# Patient Record
Sex: Male | Born: 1960 | Race: Black or African American | Marital: Single | Smoking: Former smoker
Health system: Northeastern US, Academic
[De-identification: ages and names within clinical notes are randomized; demographics above are authoritative.]

## PROBLEM LIST (undated history)

## (undated) DIAGNOSIS — I10 Essential (primary) hypertension: Secondary | ICD-10-CM

## (undated) DIAGNOSIS — F32A Depression, unspecified: Secondary | ICD-10-CM

## (undated) DIAGNOSIS — E119 Type 2 diabetes mellitus without complications: Secondary | ICD-10-CM

## (undated) DIAGNOSIS — M549 Dorsalgia, unspecified: Secondary | ICD-10-CM

## (undated) HISTORY — PX: APPENDECTOMY: SHX54

## (undated) HISTORY — PX: ANKLE SURGERY: SHX546

## (undated) HISTORY — DX: Dorsalgia, unspecified: M54.9

## (undated) HISTORY — PX: ORTHOPEDIC SURGERY: SHX850

---

## 2006-10-13 ENCOUNTER — Emergency Department (HOSPITAL_COMMUNITY): Admission: EM | Admit: 2006-10-13 | Discharge: 2006-10-13 | Payer: Self-pay | Admitting: Emergency Medicine

## 2007-03-07 ENCOUNTER — Emergency Department (HOSPITAL_COMMUNITY): Admission: EM | Admit: 2007-03-07 | Discharge: 2007-03-07 | Payer: Self-pay | Admitting: Emergency Medicine

## 2007-03-15 ENCOUNTER — Emergency Department (HOSPITAL_COMMUNITY): Admission: EM | Admit: 2007-03-15 | Discharge: 2007-03-15 | Payer: Self-pay | Admitting: Emergency Medicine

## 2007-03-24 ENCOUNTER — Emergency Department (HOSPITAL_COMMUNITY): Admission: EM | Admit: 2007-03-24 | Discharge: 2007-03-25 | Payer: Self-pay | Admitting: Emergency Medicine

## 2009-03-21 ENCOUNTER — Encounter: Payer: Self-pay | Admitting: Cardiology

## 2009-03-21 ENCOUNTER — Encounter: Payer: Self-pay | Admitting: Cardiovascular Disease

## 2009-03-22 ENCOUNTER — Other Ambulatory Visit: Payer: Self-pay | Admitting: Cardiology

## 2010-04-05 ENCOUNTER — Ambulatory Visit
Admit: 2010-04-05 | Discharge: 2010-04-05 | Disposition: A | Discharge status: Home / Self Care | Payer: Self-pay | Source: Ambulatory Visit | Attending: Family Medicine | Admitting: Family Medicine

## 2010-04-05 LAB — COMPREHENSIVE METABOLIC PANEL
ALT: 26 U/L (ref 0–50)
AST: 19 U/L (ref 0–50)
Albumin: 4.5 g/dL (ref 3.5–5.2)
Alk Phos: 52 U/L (ref 40–130)
Anion Gap: 9 (ref 7–16)
Bilirubin,Total: 0.4 mg/dL (ref 0.0–1.2)
CO2: 28 mmol/L (ref 20–28)
Calcium: 8.9 mg/dL (ref 8.6–10.2)
Chloride: 105 mmol/L (ref 96–108)
Creatinine: 1.04 mg/dL (ref 0.67–1.17)
GFR,Black: >59 *
GFR,Caucasian: >59 *
Glucose: 84 mg/dL (ref 74–106)
Lab: 11 mg/dL (ref 6–20)
Potassium: 4.3 mmol/L (ref 3.3–5.1)
Sodium: 142 mmol/L (ref 133–145)
Total Protein: 7.2 g/dL (ref 6.3–7.7)

## 2010-04-25 ENCOUNTER — Ambulatory Visit
Admit: 2010-04-25 | Discharge: 2010-04-25 | Disposition: A | Discharge status: Home / Self Care | Payer: Self-pay | Source: Ambulatory Visit | Attending: Pediatrics | Admitting: Pediatrics

## 2010-04-25 LAB — URINALYSIS WITH MICROSCOPIC
Blood,UA: NEGATIVE
Ketones, UA: NEGATIVE
Leuk Esterase,UA: NEGATIVE
Nitrite,UA: NEGATIVE
Protein,UA: NEGATIVE mg/dL
RBC,UA: 1 /HPF (ref 0–2)
Specific Gravity,UA: 1.02 (ref 1.002–1.030)
WBC,UA: <1 /HPF (ref 0–5)
pH,UA: 5.5 (ref 5.0–8.0)

## 2010-04-26 LAB — MICROALBUMIN, URINE, RANDOM
Creatinine,UR: 215 mg/dL (ref 20–300)
Microalb/Creat Ratio: <1.4 mg MA/g CR (ref 0.0–29.9)
Microalbumin,UR: <0.3 mg/dL

## 2010-04-26 LAB — DRUG SCREEN CHEMICAL DEPENDENCY, URINE
Amphetamine,UR: NEGATIVE
Benzodiazepinen,UR: NEGATIVE
Cocaine/Metab,UR: NEGATIVE
Opiates,UR: NEGATIVE
THC Metabolite,UR: NEGATIVE

## 2010-04-29 ENCOUNTER — Other Ambulatory Visit: Payer: Self-pay | Admitting: Gastroenterology

## 2010-04-29 ENCOUNTER — Encounter: Payer: Self-pay | Admitting: Obstetrics & Gynecology

## 2010-04-29 ENCOUNTER — Emergency Department
Admission: EM | Admit: 2010-04-29 | Discharge status: Against Medical Advice | Payer: Self-pay | Source: Ambulatory Visit | Attending: Emergency Medicine | Admitting: Emergency Medicine

## 2010-04-29 HISTORY — DX: Essential (primary) hypertension: I10

## 2010-04-29 HISTORY — DX: Type 2 diabetes mellitus without complications: E11.9

## 2010-04-29 HISTORY — DX: Depression, unspecified: F32.A

## 2010-04-29 LAB — CBC AND DIFFERENTIAL
Baso # K/uL: 0.1 THOU/uL (ref 0.0–0.1)
Basophil %: 0.6 % (ref 0.2–1.2)
Eos # K/uL: 0.4 THOU/uL (ref 0.0–0.5)
Eosinophil %: 4.3 % (ref 0.8–7.0)
Hematocrit: 38 % — ABNORMAL LOW (ref 40–51)
Hemoglobin: 13 g/dL — ABNORMAL LOW (ref 13.7–17.5)
Lymph # K/uL: 2.6 THOU/uL (ref 1.3–3.6)
Lymphocyte %: 31.5 % (ref 21.8–53.1)
MCV: 85 fL (ref 79–92)
Mono # K/uL: 0.5 THOU/uL (ref 0.3–0.8)
Monocyte %: 6.5 % (ref 5.3–12.2)
Neut # K/uL: 4.7 THOU/uL (ref 1.8–5.4)
Platelets: 235 THOU/uL (ref 150–330)
RBC: 4.5 MIL/uL — ABNORMAL LOW (ref 4.6–6.1)
RDW: 13.1 % (ref 11.6–14.4)
Seg Neut %: 57.1 % (ref 34.0–67.9)
WBC: 8.2 THOU/uL (ref 4.2–9.1)

## 2010-04-29 LAB — PLASMA PROF 7 (ED ONLY)
Anion Gap,PL: 6 — ABNORMAL LOW (ref 7–16)
CO2,Plasma: 29 mmol/L — ABNORMAL HIGH (ref 20–28)
Chloride,Plasma: 106 mmol/L (ref 96–108)
Creatinine: 1.12 mg/dL (ref 0.67–1.17)
GFR,Black: >59 *
GFR,Caucasian: >59 *
Glucose,Plasma: 97 mg/dL (ref 74–106)
Potassium,Plasma: 3.8 mmol/L (ref 3.4–4.7)
Sodium,Plasma: 141 mmol/L (ref 132–146)
UN,Plasma: 16 mg/dL (ref 6–20)

## 2010-04-29 LAB — HOLD SST

## 2010-04-29 LAB — TROPONIN T
Troponin T: <0.01 ng/mL (ref 0.00–0.02)
Troponin T: <0.01 ng/mL (ref 0.00–0.02)

## 2010-04-29 LAB — APTT: aPTT: 27.5 s (ref 22.3–35.3)

## 2010-04-29 MED ORDER — ALBUTEROL SULFATE (2.5 MG/3ML) 0.083% IN NEBU *I*
2.5000 mg | INHALATION_SOLUTION | Freq: Once | RESPIRATORY_TRACT | Status: AC
Start: 2010-04-29 — End: 2010-04-29
  Administered 2010-04-29: 2.5 mg via RESPIRATORY_TRACT
  Filled 2010-04-29: qty 3

## 2010-04-29 NOTE — ED Notes (Addendum)
Pt c/o SOB congestion chest pain over last few days increasing today.    ED GN ATTESTATION       I Abelardo Diesel, RN (RN) reviewed the following charting information by the GN:  Barry [name]    Nursing Assessments  Medications  Plan of Care  Teaching   Notes    In the chart of Barry [name] (49 y.o. male) and attest to the charting being accurate.

## 2010-04-29 NOTE — ED Notes (Addendum)
Pt c/o cough for 2 weeks.  Short of breath with coughing spells.  No chest pain.  Denies fever/chills.  Alert and talkative.  Appears in no acute distress.  Skin warma dn andy/

## 2010-04-29 NOTE — ED Notes (Signed)
This nurse approached by Md who stated that the pt had left AMA.  Signed form.  Unsure if patient's Iv had been removed.  Md caught pt in waiting room as he was leaving and reports pt self d/ed iv.

## 2010-04-29 NOTE — ED Provider Notes (Signed)
History   Chief Complaint   Patient presents with   . Shortness of Breath       HPI Comments: Pt is a 49 yo male who presents a dry cough that has been present for approximately 2 weeks.  Pt has shortness of breath after he has a coughing spell.  Pt denies fevers, chills, chest pain, palpitations, dizziness.  Pt denies abdominal pain, nausea, vomiting.  Pt states that has asthma and doesn't have his inhalers because "his PCP didn't order them for him."      The history is provided by the patient.       Past Medical History   Diagnosis Date   . Asthma    . HTN (hypertension)    . DM (diabetes mellitus)    . Depression          History reviewed.  No pertinent past surgical history.    Family History   Problem Relation Age of Onset   . Diabetes Maternal Grandfather           reports that he has been smoking cigarettes.  He has been smoking about .5 packs per day. He does not have any smokeless tobacco history on file.  He reports that he does not currently drink alcohol or use illicit drugs.    Review of Systems   Review of Systems   Constitutional: Negative.    HENT: Negative.    Eyes: Negative.    Respiratory: Positive for cough and shortness of breath. Negative for apnea, choking, chest tightness, wheezing and stridor.    Cardiovascular: Negative.    Gastrointestinal: Negative.    Genitourinary: Negative.    Musculoskeletal: Negative.    Skin: Negative.    Neurological: Negative.    Hematological: Negative.    Psychiatric/Behavioral: Negative.        Physical Exam   BP 113/68  Pulse 71  Temp(Src) 36.5 C (97.7 F) (Oral)  Resp 12  SpO2 95%    Physical Exam   Nursing note and vitals reviewed.  Constitutional: He is oriented to person, place, and time. He appears well-developed and well-nourished. No distress.   HENT:   Head: Normocephalic and atraumatic.   Right Ear: External ear normal.   Left Ear: External ear normal.   Nose: Nose normal.   Mouth/Throat: Oropharynx is clear and moist.   Eyes: Conjunctivae and  EOM are normal. Pupils are equal, round, and reactive to light.   Neck: Normal range of motion. Neck supple.   Pulmonary/Chest: Effort normal and breath sounds normal. No respiratory distress. He has no wheezes. He has no rales. He exhibits no tenderness.   Abdominal: Soft. Bowel sounds are normal. He exhibits no distension. No tenderness. He has no rebound and no guarding.   Neurological: He is alert and oriented to person, place, and time.   Skin: Skin is warm and dry. He is not diaphoretic.   Psychiatric: He has a normal mood and affect. His behavior is normal. Thought content normal.       Medical Decision Making   MDM  Number of Diagnoses or Management Options  Diagnosis management comments: 49 yo male who presents with 2 week h/o dry cough.  Prior to evaluation, pt had CBC, chem, troponin, EKG, CXR and spine x-ray.  Results WNL.  First troponin negative.  Will give albuterol nebulizer now.  Pt will stay for second troponin in 6 hrs.  If negative, will likely d/c pt home.  Elenor Legato, MD

## 2010-05-03 LAB — EKG 12-LEAD
P: 55 degrees
PR: 156 ms
QRS: 12 degrees
QRSD: 74 ms
QT: 348 ms
QTc: 409 ms
Rate: 83 {beats}/min
Severity: NORMAL
T: 16 degrees

## 2010-05-08 ENCOUNTER — Ambulatory Visit
Admit: 2010-05-08 | Discharge: 2010-05-08 | Disposition: A | Discharge status: Home / Self Care | Payer: Self-pay | Source: Ambulatory Visit | Attending: Pediatrics | Admitting: Pediatrics

## 2010-05-08 LAB — LIPID PANEL
Chol/HDL Ratio: 5.7
Cholesterol: 229 mg/dL — AB
HDL: 40 mg/dL
LDL Calculated: 169 mg/dL
Non HDL Cholesterol: 189 mg/dL
Triglycerides: 99 mg/dL

## 2010-05-09 LAB — HEMOGLOBIN A1C: Hemoglobin A1C: 6.7 % — ABNORMAL HIGH (ref 4.0–6.0)

## 2010-06-19 ENCOUNTER — Institutional Professional Consult (permissible substitution): Payer: Self-pay

## 2010-10-18 ENCOUNTER — Ambulatory Visit
Admit: 2010-10-18 | Discharge: 2010-10-18 | Disposition: A | Discharge status: Home / Self Care | Payer: Self-pay | Source: Ambulatory Visit | Attending: Internal Medicine | Admitting: Internal Medicine

## 2010-10-18 LAB — GLUCOSE: Glucose: 105 mg/dL (ref 74–106)

## 2010-10-18 LAB — HEPATIC FUNCTION PANEL
ALT: 22 U/L (ref 0–50)
AST: 19 U/L (ref 0–50)
Albumin: 4.3 g/dL (ref 3.5–5.2)
Alk Phos: 59 U/L (ref 40–130)
Bilirubin,Direct: <0.2 mg/dL (ref 0.0–0.3)
Bilirubin,Total: 0.3 mg/dL (ref 0.0–1.2)
Total Protein: 7 g/dL (ref 6.3–7.7)

## 2010-10-18 LAB — LIPID PANEL
Chol/HDL Ratio: 5.2
Cholesterol: 250 mg/dL — AB
HDL: 48 mg/dL
LDL Calculated: 182 mg/dL
Non HDL Cholesterol: 202 mg/dL
Triglycerides: 101 mg/dL

## 2010-10-19 LAB — HEMOGLOBIN A1C: Hemoglobin A1C: 6.8 % — ABNORMAL HIGH (ref 4.0–6.0)

## 2011-11-27 ENCOUNTER — Ambulatory Visit
Admit: 2011-11-27 | Discharge: 2011-11-27 | Disposition: A | Discharge status: Home / Self Care | Payer: Self-pay | Source: Ambulatory Visit | Attending: Medical | Admitting: Medical

## 2011-11-27 LAB — TSH: TSH: 1.23 u[IU]/mL (ref 0.27–4.20)

## 2011-11-27 LAB — PSA (EFF.4-2010): PSA (eff. 4-2010): 0.79 ng/mL (ref 0.00–4.00)

## 2011-11-28 LAB — HEPATITIS A,B,C PROF
HBV Core Ab: NEGATIVE
HBV S Ab Quant: 409 IU/L
HBV S Ab: POSITIVE
HBV S Ag: NEGATIVE
Hep A Total Ab: POSITIVE
Hep C Ab: NEGATIVE

## 2011-12-02 LAB — VITAMIN D
25-OH VIT D2: <4 ng/mL
25-OH VIT D3: 9 ng/mL
25-OH Vit Total: 9 ng/mL — ABNORMAL LOW (ref 30–80)

## 2011-12-13 ENCOUNTER — Ambulatory Visit
Admit: 2011-12-13 | Discharge: 2011-12-13 | Disposition: A | Discharge status: Home / Self Care | Payer: Self-pay | Source: Ambulatory Visit | Attending: Adult Health | Admitting: Adult Health

## 2011-12-13 LAB — DRUG SCREEN CHEMICAL DEPENDENCY, URINE
Amphetamine,UR: NEGATIVE
Benzodiazepinen,UR: NEGATIVE
Cocaine/Metab,UR: NEGATIVE
Opiates,UR: POSITIVE
THC Metabolite,UR: NEGATIVE

## 2011-12-17 LAB — CONFIRM OPIATES: Confirm Opiates: POSITIVE

## 2012-01-02 ENCOUNTER — Emergency Department
Admission: EM | Admit: 2012-01-02 | Disposition: A | Discharge status: Home / Self Care | Payer: Self-pay | Source: Ambulatory Visit | Attending: Emergency Medicine | Admitting: Emergency Medicine

## 2012-01-03 ENCOUNTER — Encounter: Payer: Self-pay | Admitting: Emergency Medicine

## 2012-01-03 LAB — POCT URINALYSIS DIPSTICK
Bilirubin,Ur: NEGATIVE
Blood,UA POCT: NEGATIVE
Glucose,UA POCT: NORMAL
Ketones,UA POCT: NEGATIVE
Leuk Esterase,UA POCT: NEGATIVE
Lot #: 21421202
Nitrite,UA POCT: NEGATIVE
PH,UA POCT: 5 (ref 5–8)
Protein,UA POCT: NEGATIVE mg/dL
Specific gravity,UA POCT: 1.01 (ref 1.002–1.03)
Urobilinogen,UA: NORMAL

## 2012-01-03 MED ORDER — KETOROLAC TROMETHAMINE 30 MG/ML IJ SOLN *I*
30.0000 mg | Freq: Once | INTRAMUSCULAR | Status: AC
Start: 2012-01-03 — End: 2012-01-03
  Filled 2012-01-03: qty 1

## 2012-01-03 MED ORDER — OXYCODONE-ACETAMINOPHEN 5-325 MG PO TABS *I*
1.0000 | ORAL_TABLET | ORAL | Status: AC | PRN
Start: 2012-01-03 — End: 2012-01-06

## 2012-01-03 MED ORDER — IBUPROFEN 800 MG PO TABS *I*
800.0000 mg | ORAL_TABLET | Freq: Three times a day (TID) | ORAL | Status: AC | PRN
Start: 2012-01-03 — End: 2012-01-08

## 2012-01-03 MED ORDER — KETOROLAC TROMETHAMINE 30 MG/ML IJ SOLN *I*
30.0000 mg | Freq: Once | INTRAMUSCULAR | Status: DC
Start: 2012-01-03 — End: 2012-01-03

## 2012-08-28 ENCOUNTER — Emergency Department
Admission: EM | Admit: 2012-08-28 | Disposition: A | Discharge status: Home / Self Care | Payer: Self-pay | Source: Ambulatory Visit | Attending: Emergency Medicine | Admitting: Emergency Medicine

## 2012-08-28 ENCOUNTER — Encounter: Payer: Self-pay | Admitting: Emergency Medicine

## 2012-08-28 MED ORDER — KETOROLAC TROMETHAMINE 30 MG/ML IJ SOLN *I*
30.0000 mg | Freq: Once | INTRAMUSCULAR | Status: DC
Start: 2012-08-28 — End: 2012-08-28

## 2012-08-28 MED ORDER — OXYCODONE-ACETAMINOPHEN 5-325 MG PO TABS *I*
1.0000 | ORAL_TABLET | Freq: Once | ORAL | Status: AC
Start: 2012-08-28 — End: 2012-08-28
  Administered 2012-08-28: 1 via ORAL
  Filled 2012-08-28: qty 1

## 2012-08-28 MED ORDER — OXYCODONE-ACETAMINOPHEN 5-325 MG PO TABS *I*
1.0000 | ORAL_TABLET | Freq: Four times a day (QID) | ORAL | Status: AC | PRN
Start: 2012-08-28 — End: 2012-08-31

## 2012-08-28 MED ORDER — DIAZEPAM 5 MG PO TABS *I*
5.0000 mg | ORAL_TABLET | Freq: Once | ORAL | Status: AC
Start: 2012-08-28 — End: 2012-08-28
  Administered 2012-08-28: 5 mg via ORAL
  Filled 2012-08-28: qty 1

## 2012-08-28 MED ORDER — KETOROLAC TROMETHAMINE 30 MG/ML IJ SOLN *I*
30.0000 mg | Freq: Once | INTRAMUSCULAR | Status: AC
Start: 2012-08-28 — End: 2012-08-28
  Filled 2012-08-28: qty 1

## 2012-08-28 NOTE — ED Notes (Cosign Needed)
Presents with back pain with trouble standing and weak legs.  He denies any trauma or past injuries to back.  Denies any loss of bowel or bladder.

## 2012-08-28 NOTE — Consults (Signed)
[facility] SOCIAL WORK  PHARMACY FORM  Today's date:  August 28, 2012    Patient Name: Barry [name]   Medical Record #:  [id number]       Patient's Address:  [address]                     DOB:  Jan 02, 1961      Social Worker: Marilynne Drivers, LMSW      Date of Service: August 28, 2012         Funding Source   []  Medicaid Pending       [x]  SW Medication Assistance Fund   []  [employer]     [] Other source                    ___________________________________________________________________  Pharmacy Information:     Date/time sent:August 28, 2012 Time needed:   ASAP        [x]  ED []  After hours []  Outpatient []  Inpatient (Specify unit) [id number]     []  Please call ext.     when ready  [x]  Patient will pick up at pharmacy   []  Please tube to station #      when ready      Pharmacy Contact:         Social work signature: Marilynne Drivers, LMSW  Supervisor/Manager Approval (if indicated) :         Date:  (supervisor signature not required for [facility] pending)    Pharmacy Fax # [phone]  Tube [phone]  Print form and fax or tube to pharmacy.  If it requires Supervisor approval copy and email to supervisor

## 2012-08-28 NOTE — ED Provider Notes (Addendum)
History     Chief Complaint   Patient presents with   . Back Pain     HPI Comments: 51 yo M with history of chronic back pain here with back pain. States it is worse than usual back pain, he feels that his legs are weak. Gets occasional shooting pain down bilater lower extremities with certain movements. All symptoms worsened after recent exercise stress test at [facility]. No fevers or chills. No numbness or tingling, no bowel or bladder retention or incontinence. No inciting trauma/event/motion. No h/o cancer, immunosuppression, or IVDU. Has appt with PCP on Monday.     Patient is a 51 y.o. male presenting with back pain. The history is provided by the patient. No language interpreter was used.   Back Pain   This is a chronic problem. The current episode started more than 1 week ago. The problem occurs constantly. The problem has been gradually worsening. The pain is associated with no known injury. The pain is present in the thoracic spine and lumbar spine. The quality of the pain is described as cramping. The pain radiates to the left thigh and right thigh. The pain is at a severity of 10/10. The pain is severe. The symptoms are aggravated by certain positions, bending and twisting. Pertinent negatives include no chest pain, no fever, no numbness, no headaches, no abdominal pain, no dysuria and no weakness. He has tried nothing for the symptoms.       Past Medical History   Diagnosis Date   . Asthma    . HTN (hypertension)    . DM (diabetes mellitus)    . Depression    . Back pain             History reviewed. No pertinent past surgical history.    Family History   Problem Relation Age of Onset   . Diabetes Maternal Grandfather          Social History      reports that he has quit smoking. His smoking use included Cigarettes. He smoked 0.50 packs per day. He does not have any smokeless tobacco history on file. He reports that he does not drink alcohol or use illicit drugs. His sexual activity history not on  file.    Living Situation    Questions Responses    Patient lives with Significant Other    Homeless     Caregiver for other family member     External Services     Employment Employed    Domestic Violence Risk           Review of Systems   Review of Systems   Constitutional: Negative for fever and chills.   HENT: Negative for neck pain.    Eyes: Negative for pain.   Respiratory: Negative for shortness of breath.    Cardiovascular: Negative for chest pain.   Gastrointestinal: Negative for nausea, vomiting, abdominal pain and diarrhea.   Endocrine: Negative for polyuria.   Genitourinary: Negative for dysuria and hematuria.   Musculoskeletal: Positive for back pain. Negative for gait problem.   Skin: Negative for rash.   Allergic/Immunologic: Negative for immunocompromised state.   Neurological: Negative for weakness, light-headedness, numbness and headaches.   Hematological: Does not bruise/bleed easily.   Psychiatric/Behavioral: Negative for confusion.       Physical Exam     ED Triage Vitals   BP Heart Rate Heart Rate(via Pulse Ox) Resp Temp Temp src SpO2 O2 Device O2 Flow Rate   08/28/12  1610 08/28/12 9604 -- 08/28/12 5409 08/28/12 8119 -- 08/28/12 0855 08/28/12 0855 --   125/73 mmHg 70   16  36.6 C (97.9 F)  95 % None (Room air)       weight           08/28/12 0855           99.338 kg (219 lb)               Physical Exam   Nursing note and vitals reviewed.  Constitutional: He is oriented to person, place, and time. He appears well-developed and well-nourished. He is sleeping and cooperative. He is easily aroused. No distress.   HENT:   Head: Normocephalic and atraumatic.   Mouth/Throat: Oropharynx is clear and moist.   Eyes: Conjunctivae and EOM are normal. Pupils are equal, round, and reactive to light.   Neck: Normal range of motion. Neck supple.   Cardiovascular: Normal rate, regular rhythm, normal heart sounds and intact distal pulses.    Pulmonary/Chest: Effort normal and breath sounds normal. He exhibits  no tenderness.   Abdominal: Soft. Bowel sounds are normal. There is no tenderness.   Musculoskeletal:        Cervical back: Normal.        Thoracic back: Normal.        Lumbar back: He exhibits decreased range of motion ( limited by pain). He exhibits no tenderness and no bony tenderness.   Lymphadenopathy:     He has no cervical adenopathy.   Neurological: He is alert, oriented to person, place, and time and easily aroused. He has normal strength. No sensory deficit. He displays a negative Romberg sign.   Reflex Scores:       Patellar reflexes are 2+ on the right side and 2+ on the left side.  Gait: able to ambulate with some difficulty, secondary to pain   Skin: Skin is warm and dry.   Psychiatric: He has a normal mood and affect.       Medical Decision Making      Amount and/or Complexity of Data Reviewed  Review and summarize past medical records: yes        Initial Evaluation:  ED First Provider Contact    Date/Time Event User Comments    08/28/12 [phone] ED Provider First Contact Rachael Fee Initial Face to Face Provider Contact          Patient seen by me 08/28/12 at 1039    Assessment:  51 y.o., male comes to the ED with back pain, acute on chronic, without red flags in history or exam.    Differential Diagnosis includes musculoskeletal strain, exacerbation of chronic back pain; doubt fracture (no risk factors or trauma), no neurologic signs concerning for spinal cord involvement    No indication for imaging at this time.             Plan:   -Analgesia  -ambulation trial  -Dispo: if pt able to ambulate and pain controlled on po medication will discharge home with short course of pain medication and PCP follow up on Monday as scheduled      Kathreen Cornfield, MD    Kathreen Cornfield, MD  08/28/12 1700        Patient seen by me today, 08/28/2012 at 11:50 am    History:   I reviewed this patient, reviewed the resident note and agree, with edits as above     Exam:    I examined this patient,  reviewed the resident note and  agree     Decision Making:   I discussed with the documented resident decision making and agree, with edits as above    Author Kathryne Hitch, MD          Kathryne Hitch, MD  08/28/12 2254    Kathryne Hitch, MD  09/11/12 1511

## 2012-08-28 NOTE — First Provider Contact (Signed)
ED Medical Screening Exam Note    Initial provider evaluation performed by   ED First Provider Contact    Date/Time Event User Comments    08/28/12 [phone] ED Provider First Contact Rachael Fee Initial Face to Face Provider Contact        51 year old male who presents to the ED with c/o low back pain since Tuesday.  Pain radiates down both legs, right > left. Reports being seen at [facility] for chest pain, kept overnight and had stress test in the AM. Discharged Wednesday morning, went back to [facility] on Thursday for back pain, given ibuprofen and muscle relaxer (never filled b/c could not afford). Reports back pain was worse this morning and states he was unable to get out of bed. Admits to having to crawl out of bed and down the stairs. Reports h/o back pain in the past, but nothing like this.  Denies injury/trauma, as well as bowel and bladder incontinence. + numbness/tingling down right leg. Has an appointment with primary MD on Monday, but unable to wait that long. Has not taken any meds this morning.    ANALGESIA ordered.      Patient requires further evaluation.     [place], [place], 08/28/2012, 9:34 AM

## 2012-08-28 NOTE — ED Notes (Signed)
Bed:AC-44R<BR> Expected date:<BR> Expected time:<BR> Means of arrival:<BR> Comments:<BR>

## 2012-08-28 NOTE — Consults (Signed)
Contacts: [facility] Social Worker -Hermenia Fiscal- main SW office receptionist [phone]    Intervention:   Pharmacy voucher provided- to cover pt's $1.10 co-pay, which he states he cannot afford. Tubed to outpatient pharmacy.    Plan:   There are no further Social Work needs identified at this time. Please refer to  Social Work if needs arise.       Marilynne Drivers, LMSW  [phone]

## 2012-08-28 NOTE — ED Notes (Signed)
Patient arrives to [facility] from EMS. Patient complaining that he was sent to [facility] from ambulance. Explained to patient that he would be seen as soon as [facility] was available. Patient remains upset at this time.

## 2012-08-28 NOTE — ED Notes (Signed)
C/o low back pain radiating down right leg. Seen and d/c'd at [facility] yesterday for same.

## 2012-08-31 ENCOUNTER — Other Ambulatory Visit: Payer: Self-pay | Admitting: Primary Care

## 2012-08-31 ENCOUNTER — Ambulatory Visit
Admit: 2012-08-31 | Discharge: 2012-08-31 | Disposition: A | Discharge status: Home / Self Care | Payer: Self-pay | Source: Ambulatory Visit | Attending: Primary Care | Admitting: Primary Care

## 2012-08-31 DIAGNOSIS — IMO0002 Reserved for concepts with insufficient information to code with codable children: Secondary | ICD-10-CM

## 2012-09-21 ENCOUNTER — Ambulatory Visit
Admit: 2012-09-21 | Discharge: 2012-09-21 | Disposition: A | Discharge status: Home / Self Care | Payer: Self-pay | Source: Ambulatory Visit | Attending: Primary Care | Admitting: Primary Care

## 2012-09-21 LAB — PAIN CLINIC PROFILE
Amphetamine,UR: NEGATIVE
Benzodiazepinen,UR: NEGATIVE
Cocaine/Metab,UR: NEGATIVE
Opiates,UR: NEGATIVE
Oxycodone/Oxymorphone,UR: NEGATIVE
THC Metabolite,UR: NEGATIVE

## 2012-09-22 LAB — MICROALBUMIN, URINE, RANDOM
Creatinine,UR: 140 mg/dL (ref 20–300)
Microalb/Creat Ratio: <2.1 mg MA/g CR (ref 0.0–29.9)
Microalbumin,UR: <0.3 mg/dL

## 2013-01-11 ENCOUNTER — Encounter: Payer: Self-pay | Admitting: Emergency Medicine

## 2013-01-11 ENCOUNTER — Emergency Department
Admission: EM | Admit: 2013-01-11 | Disposition: A | Discharge status: Home / Self Care | Payer: Self-pay | Source: Ambulatory Visit

## 2013-01-11 MED ORDER — OXYCODONE-ACETAMINOPHEN 5-325 MG PO TABS *I*
1.0000 | ORAL_TABLET | Freq: Four times a day (QID) | ORAL | Status: AC | PRN
Start: 2013-01-11 — End: 2013-01-13

## 2013-01-11 MED ORDER — HYDROMORPHONE HCL PF 1 MG/ML IJ SOLN *WRAPPED*
INTRAMUSCULAR | Status: AC
Start: 2013-01-11 — End: 2013-01-11
  Filled 2013-01-11: qty 1

## 2013-01-11 MED ORDER — KETOROLAC TROMETHAMINE 30 MG/ML IJ SOLN *I*
INTRAMUSCULAR | Status: AC
Start: 2013-01-11 — End: 2013-01-11
  Filled 2013-01-11: qty 2

## 2013-01-11 MED ORDER — DIAZEPAM 5 MG PO TABS *I*
10.0000 mg | ORAL_TABLET | Freq: Once | ORAL | Status: AC
Start: 2013-01-11 — End: 2013-01-11

## 2013-01-11 MED ORDER — DIAZEPAM 5 MG PO TABS *I*
ORAL_TABLET | ORAL | Status: AC
Start: 2013-01-11 — End: 2013-01-11
  Administered 2013-01-11: 10 mg via ORAL
  Filled 2013-01-11: qty 2

## 2013-01-11 MED ORDER — KETOROLAC TROMETHAMINE 30 MG/ML IJ SOLN *I*
45.0000 mg | Freq: Once | INTRAMUSCULAR | Status: AC
Start: 2013-01-11 — End: 2013-01-11

## 2013-01-11 MED ORDER — CYCLOBENZAPRINE HCL 10 MG PO TABS *I*
10.0000 mg | ORAL_TABLET | Freq: Three times a day (TID) | ORAL | Status: AC | PRN
Start: 2013-01-11 — End: 2013-01-16

## 2013-01-11 MED ORDER — HYDROMORPHONE HCL PF 1 MG/ML IJ SOLN *WRAPPED*
1.0000 mg | Freq: Once | INTRAMUSCULAR | Status: AC
Start: 2013-01-11 — End: 2013-01-11
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
## 2013-01-11 NOTE — ED Notes (Signed)
Pt seen and evaluated by provider.  Medicated per orders.  No further nursing interventions required.   Discharge instructions reviewed.  Pt verbalized understanding.  To walk pt to [facility] outpatient pharmacy to fill rx.

## 2013-01-11 NOTE — Discharge Instructions (Signed)
Follow up with your pcp as needed    Rest  hea tto back    Do not drink alcohol or drive while on medication    Take 800 mg Ibuprofen with 10 mg flexeril every 8 hrs x 2 days then as needed    Percocet for breakthrough pain

## 2013-01-11 NOTE — Progress Notes (Signed)
SW spoke with Norwood Levo at [employer] and set up [employer] to pick pt up in about 20 minutes. Medicaid invoice number [id number]. Confirmed with PA Ree Kida that pt is able to travel by cab safely.  Bobbye Riggs, LMSW  01/11/2013  5:54 PM

## 2013-02-23 ENCOUNTER — Inpatient Hospital Stay
Admission: EM | Admit: 2013-02-23 | Disposition: A | Discharge status: Home / Self Care | Payer: Self-pay | Source: Ambulatory Visit | Attending: Orthopedic Surgery | Admitting: Orthopedic Surgery

## 2013-02-23 ENCOUNTER — Encounter: Payer: Self-pay | Admitting: Student in an Organized Health Care Education/Training Program

## 2013-02-23 LAB — CBC AND DIFFERENTIAL
Baso # K/uL: 0 10*3/uL (ref 0.0–0.1)
Basophil %: 0.4 % (ref 0.2–1.2)
Eos # K/uL: 0.3 10*3/uL (ref 0.0–0.5)
Eosinophil %: 4.4 % (ref 0.8–7.0)
Hematocrit: 39 % — ABNORMAL LOW (ref 40–51)
Hemoglobin: 13 g/dL — ABNORMAL LOW (ref 13.7–17.5)
Lymph # K/uL: 2.8 10*3/uL (ref 1.3–3.6)
Lymphocyte %: 37.1 % (ref 21.8–53.1)
MCV: 84 fL (ref 79–92)
Mono # K/uL: 0.7 10*3/uL (ref 0.3–0.8)
Monocyte %: 9.6 % (ref 5.3–12.2)
Neut # K/uL: 3.6 10*3/uL (ref 1.8–5.4)
Platelets: 323 10*3/uL (ref 150–330)
RBC: 4.7 MIL/uL (ref 4.6–6.1)
RDW: 13.6 % (ref 11.6–14.4)
Seg Neut %: 48.5 % (ref 34.0–67.9)
WBC: 7.4 10*3/uL (ref 4.2–9.1)

## 2013-02-23 LAB — HOLD LAVENDER

## 2013-02-23 LAB — BLOOD BANK HOLD RED

## 2013-02-23 LAB — PROTIME-INR
INR: 1.1 (ref 1.0–1.2)
Protime: 10.8 s (ref 9.2–12.3)

## 2013-02-23 LAB — HOLD GREEN WITH GEL

## 2013-02-23 LAB — HOLD GRAY

## 2013-02-23 LAB — BLOOD BANK HOLD LAVENDER

## 2013-02-23 LAB — HM HIV SCREENING OFFERED

## 2013-02-23 LAB — HOLD RED

## 2013-02-23 LAB — HOLD BLUE

## 2013-02-23 LAB — HOLD SST

## 2013-02-23 MED ORDER — ONDANSETRON HCL 2 MG/ML IV SOLN *I*
4.0000 mg | Freq: Four times a day (QID) | INTRAMUSCULAR | Status: AC | PRN
Start: 2013-02-23 — End: 2013-02-23
  Administered 2013-02-23: 4 mg via INTRAVENOUS
  Filled 2013-02-23: qty 2

## 2013-02-23 MED ORDER — HYDROMORPHONE HCL PF 1 MG/ML IJ SOLN *WRAPPED*
1.0000 mg | INTRAMUSCULAR | Status: DC | PRN
Start: 2013-02-23 — End: 2013-02-24
  Administered 2013-02-23 – 2013-02-24 (×3): 1 mg via INTRAVENOUS
  Filled 2013-02-23 (×3): qty 1

## 2013-02-23 NOTE — First Provider Contact (Signed)
ED Medical Screening Exam Note    Initial provider evaluation performed by me on at arrival at now    Fall, trauma to right ankle  likly fracture          XRAYS and ANALGESIA ordered.      Patient requires further evaluation.     Nelson Chimes, DO, 02/23/2013, 9:42 PM

## 2013-02-23 NOTE — ED Notes (Signed)
Was standing and slipped on ice and twisted right ankle, and has c/o neck pain.

## 2013-02-24 ENCOUNTER — Encounter: Payer: Self-pay | Admitting: Orthopedic Surgery

## 2013-02-24 ENCOUNTER — Other Ambulatory Visit: Payer: Self-pay | Admitting: Orthopedic Surgery

## 2013-02-24 DIAGNOSIS — S82899A Other fracture of unspecified lower leg, initial encounter for closed fracture: Secondary | ICD-10-CM

## 2013-02-24 LAB — PLASMA PROF 7 (ED ONLY)
Anion Gap,PL: 12 (ref 7–16)
CO2,Plasma: 25 mmol/L (ref 20–28)
Chloride,Plasma: 101 mmol/L (ref 96–108)
Creatinine: 0.83 mg/dL (ref 0.67–1.17)
GFR,Black: 117 *
GFR,Caucasian: 101 *
Glucose,Plasma: 189 mg/dL — ABNORMAL HIGH (ref 60–99)
Potassium,Plasma: 3.6 mmol/L (ref 3.4–4.7)
Sodium,Plasma: 138 mmol/L (ref 132–146)
UN,Plasma: 13 mg/dL (ref 6–20)

## 2013-02-24 LAB — BASIC METABOLIC PANEL
Anion Gap: 10 (ref 7–16)
CO2: 26 mmol/L (ref 20–28)
Calcium: 9 mg/dL (ref 8.6–10.2)
Chloride: 101 mmol/L (ref 96–108)
Creatinine: 0.82 mg/dL (ref 0.67–1.17)
GFR,Black: 118 *
GFR,Caucasian: 102 *
Glucose: 204 mg/dL — ABNORMAL HIGH (ref 60–99)
Lab: 12 mg/dL (ref 6–20)
Potassium: 4 mmol/L (ref 3.3–5.1)
Sodium: 137 mmol/L (ref 133–145)

## 2013-02-24 LAB — POCT GLUCOSE
Glucose POCT: 271 mg/dL — ABNORMAL HIGH (ref 60–99)
Glucose POCT: 166 mg/dL — ABNORMAL HIGH (ref 60–99)
Glucose POCT: 206 mg/dL — ABNORMAL HIGH (ref 60–99)
Glucose POCT: 177 mg/dL — ABNORMAL HIGH (ref 60–99)
Glucose POCT: 146 mg/dL — ABNORMAL HIGH (ref 60–99)

## 2013-02-24 LAB — RUQ PANEL (ED ONLY)
ALT: 28 U/L (ref 0–50)
AST: 19 U/L (ref 0–50)
Albumin: 4.3 g/dL (ref 3.5–5.2)
Alk Phos: 70 U/L (ref 40–130)
Amylase: 53 U/L (ref 28–100)
Bilirubin,Direct: <0.2 mg/dL (ref 0.0–0.3)
Bilirubin,Total: 0.3 mg/dL (ref 0.0–1.2)
Lipase: 29 U/L (ref 13–60)
Total Protein: 7.1 g/dL (ref 6.3–7.7)

## 2013-02-24 LAB — TYPE AND SCREEN
ABO RH Blood Type: B POS
Antibody Screen: NEGATIVE

## 2013-02-24 LAB — APTT: aPTT: 28.6 s (ref 25.8–37.9)

## 2013-02-24 LAB — HEMOGLOBIN A1C: Hemoglobin A1C: 9.2 % — ABNORMAL HIGH (ref 4.0–6.0)

## 2013-02-24 MED ORDER — HYDROCODONE-ACETAMINOPHEN 5-325 MG PO TABS *I*
1.0000 | ORAL_TABLET | ORAL | Status: DC | PRN
Start: 2013-02-24 — End: 2013-02-26

## 2013-02-24 MED ORDER — METOPROLOL TARTRATE 50 MG PO TABS *I*
50.0000 mg | ORAL_TABLET | Freq: Two times a day (BID) | ORAL | Status: DC
Start: 2013-02-24 — End: 2013-02-27
  Administered 2013-02-24 – 2013-02-27 (×7): 50 mg via ORAL
  Filled 2013-02-24 (×9): qty 1

## 2013-02-24 MED ORDER — HYDROMORPHONE HCL 2 MG/ML IJ SOLN *WRAPPED*
INTRAMUSCULAR | Status: AC
Start: 2013-02-24 — End: 2013-02-24
  Filled 2013-02-24: qty 1

## 2013-02-24 MED ORDER — SENNOSIDES 8.6 MG PO TABS *I*
2.0000 | ORAL_TABLET | Freq: Every day | ORAL | Status: DC
Start: 2013-02-24 — End: 2013-02-27
  Administered 2013-02-24 – 2013-02-27 (×4): 2 via ORAL
  Filled 2013-02-24 (×4): qty 2

## 2013-02-24 MED ORDER — HYDROMORPHONE HCL PF 1 MG/ML IJ SOLN *WRAPPED*
0.5000 mg | Freq: Once | INTRAMUSCULAR | Status: AC
Start: 2013-02-24 — End: 2013-02-24
  Administered 2013-02-24: 0.5 mg via INTRAVENOUS
  Filled 2013-02-24: qty 1

## 2013-02-24 MED ORDER — OXYCODONE HCL 5 MG PO TABS *I*
5.0000 mg | ORAL_TABLET | ORAL | Status: DC | PRN
Start: 2013-02-24 — End: 2013-02-24

## 2013-02-24 MED ORDER — ALBUTEROL SULFATE (2.5 MG/3ML) 0.083% IN NEBU *I*
2.5000 mg | INHALATION_SOLUTION | Freq: Once | RESPIRATORY_TRACT | Status: DC | PRN
Start: 2013-02-24 — End: 2013-02-24

## 2013-02-24 MED ORDER — ONDANSETRON HCL 2 MG/ML IV SOLN *I*
1.0000 mg | Freq: Once | INTRAMUSCULAR | Status: DC | PRN
Start: 2013-02-24 — End: 2013-02-24

## 2013-02-24 MED ORDER — HYDROCODONE-ACETAMINOPHEN 5-325 MG PO TABS *I*
2.0000 | ORAL_TABLET | ORAL | Status: DC | PRN
Start: 2013-02-24 — End: 2013-02-26
  Administered 2013-02-24 – 2013-02-26 (×10): 2 via ORAL
  Filled 2013-02-24 (×10): qty 2

## 2013-02-24 MED ORDER — DEXTROSE 50 % IV SOLN *I*
25.0000 g | INTRAVENOUS | Status: DC | PRN
Start: 2013-02-24 — End: 2013-02-24

## 2013-02-24 MED ORDER — OXYCODONE HCL 5 MG PO TABS *I*
10.0000 mg | ORAL_TABLET | ORAL | Status: DC | PRN
Start: 2013-02-24 — End: 2013-02-24
  Administered 2013-02-24: 10 mg via ORAL
  Filled 2013-02-24: qty 2

## 2013-02-24 MED ORDER — HYDROMORPHONE HCL 2 MG/ML IJ SOLN *WRAPPED*
0.4000 mg | INTRAMUSCULAR | Status: DC | PRN
Start: 2013-02-24 — End: 2013-02-24
  Administered 2013-02-24 (×3): 0.4 mg via INTRAVENOUS

## 2013-02-24 MED ORDER — GLUCAGON HCL (RDNA) 1 MG IJ SOLR *WRAPPED*
1.0000 mg | INTRAMUSCULAR | Status: DC | PRN
Start: 2013-02-24 — End: 2013-02-27

## 2013-02-24 MED ORDER — BISACODYL 10 MG RE SUPP *I*
10.0000 mg | Freq: Every day | RECTAL | Status: DC | PRN
Start: 2013-02-24 — End: 2013-02-27
  Administered 2013-02-27: 10 mg via RECTAL

## 2013-02-24 MED ORDER — DOCUSATE SODIUM 100 MG PO CAPS *I*
200.0000 mg | ORAL_CAPSULE | Freq: Every evening | ORAL | Status: DC
Start: 2013-02-24 — End: 2013-02-27
  Administered 2013-02-24 – 2013-02-26 (×3): 200 mg via ORAL
  Filled 2013-02-24 (×3): qty 2

## 2013-02-24 MED ORDER — CEFAZOLIN IN D5W 1 GM/50ML IV SOLN *I*
1000.0000 mg | Freq: Three times a day (TID) | INTRAVENOUS | Status: AC
Start: 2013-02-24 — End: 2013-02-25
  Administered 2013-02-24 (×2): 1000 mg via INTRAVENOUS
  Filled 2013-02-24 (×2): qty 1

## 2013-02-24 MED ORDER — MAGNESIUM HYDROXIDE 400 MG/5ML PO SUSP *I*
30.0000 mL | Freq: Every day | ORAL | Status: DC | PRN
Start: 2013-02-24 — End: 2013-02-27
  Administered 2013-02-25: 30 mL via ORAL

## 2013-02-24 MED ORDER — INSULIN LISPRO (HUMAN) 100 UNIT/ML IJ/SC SOLN *WRAPPED*
0.0000 [IU] | Freq: Three times a day (TID) | SUBCUTANEOUS | Status: DC
Start: 2013-02-24 — End: 2013-02-27
  Administered 2013-02-24: 1 [IU] via SUBCUTANEOUS
  Administered 2013-02-24: 2 [IU] via SUBCUTANEOUS
  Administered 2013-02-25 – 2013-02-26 (×4): 1 [IU] via SUBCUTANEOUS
  Administered 2013-02-27: 2 [IU] via SUBCUTANEOUS
  Administered 2013-02-27: 1 [IU] via SUBCUTANEOUS

## 2013-02-24 MED ORDER — ACETAMINOPHEN 325 MG PO TABS *I*
650.0000 mg | ORAL_TABLET | ORAL | Status: DC | PRN
Start: 2013-02-24 — End: 2013-02-24

## 2013-02-24 MED ORDER — ENOXAPARIN SODIUM 40 MG/0.4ML IJ SOSY *I*
40.0000 mg | PREFILLED_SYRINGE | INTRAMUSCULAR | Status: DC
Start: 2013-02-25 — End: 2013-02-27
  Administered 2013-02-24 – 2013-02-26 (×3): 40 mg via SUBCUTANEOUS
  Filled 2013-02-24 (×4): qty 0.4

## 2013-02-24 MED ORDER — SODIUM CHLORIDE 0.9 % IV SOLN WRAPPED *I*
100.0000 mL/h | Status: DC
Start: 2013-02-24 — End: 2013-02-27
  Administered 2013-02-24 (×2): 100 mL/h via INTRAVENOUS

## 2013-02-24 NOTE — ED Provider Notes (Addendum)
History     Chief Complaint   Patient presents with   . Fall     HPI Comments: 52 y/o male with history of HTN, DM, depression presenting to Ed with c/o right ankle pain s/p fall. According to patient, he was walking, slipped on ice and fell. Patient denies any other injuries and other wise at normal state of health.       History provided by:  Patient  Language interpreter used: No        Past Medical History   Diagnosis Date   . Asthma    . HTN (hypertension)    . DM (diabetes mellitus)    . Depression    . Back pain             History reviewed. No pertinent past surgical history.    Family History   Problem Relation Age of Onset   . Diabetes Maternal Grandfather          Social History      reports that he has quit smoking. His smoking use included Cigarettes. He smoked 0.50 packs per day. He does not have any smokeless tobacco history on file. He reports that he does not drink alcohol or use illicit drugs. His sexual activity history is not on file.    Living Situation    Questions Responses    Patient lives with Significant Other    Homeless No    Caregiver for other family member No    External Services     Employment Employed    Domestic Violence Risk           Review of Systems   Review of Systems   Constitutional: Negative for fever and chills.   HENT: Negative for congestion.    Eyes: Negative for visual disturbance.   Respiratory: Negative for shortness of breath.    Cardiovascular: Negative for chest pain.   Gastrointestinal: Negative for abdominal pain.   Endocrine: Negative for polyuria.   Genitourinary: Negative for flank pain.   Musculoskeletal: Positive for joint swelling and gait problem.   Skin: Negative for color change.   Allergic/Immunologic: Negative for immunocompromised state.   Neurological: Negative for headaches.   Hematological: Negative for adenopathy.   Psychiatric/Behavioral: Negative for confusion.       Physical Exam     ED Triage Vitals   BP Heart Rate Heart Rate(via Pulse Ox)  Resp Temp Temp src SpO2 O2 Device O2 Flow Rate   02/23/13 2124 02/23/13 2124 -- 02/23/13 2124 02/23/13 2124 -- 02/23/13 2124 02/23/13 2124 --   150/104 mmHg 90  18 36 C (96.8 F)  99 % None (Room air)       Weight           02/23/13 2124           104.327 kg (230 lb)               Physical Exam   Nursing note and vitals reviewed.  Constitutional: He is oriented to person, place, and time. He appears well-developed and well-nourished. No distress.   HENT:   Head: Normocephalic and atraumatic.   Right Ear: External ear normal.   Left Ear: External ear normal.   Nose: Nose normal.   Eyes: EOM are normal. Pupils are equal, round, and reactive to light. No scleral icterus.   Neck: Normal range of motion. Neck supple.   Cardiovascular: Normal rate, regular rhythm and intact distal pulses.  Exam reveals  no gallop and no friction rub.    No murmur heard.  Pulmonary/Chest: Effort normal and breath sounds normal. No respiratory distress. He has no wheezes. He has no rales.   Abdominal: Soft. Bowel sounds are normal. He exhibits no distension. There is no tenderness. There is no rebound and no guarding.   Musculoskeletal: He exhibits edema and tenderness.        Right knee: Normal.        Right ankle: He exhibits decreased range of motion and swelling. He exhibits no deformity and no laceration. Tenderness. Lateral malleolus and medial malleolus tenderness found.   Right ankle swelling and decrease ROM, neurovascularly intact.   Neurological: He is alert and oriented to person, place, and time. No cranial nerve deficit. Coordination normal.   Skin: Skin is warm and dry. He is not diaphoretic.   Psychiatric: He has a normal mood and affect. His behavior is normal. Thought content normal.       Medical Decision Making      Amount and/or Complexity of Data Reviewed  Clinical lab tests: ordered  Tests in the radiology section of CPT: ordered        Initial Evaluation:  ED First Provider Contact    Date/Time Event User Comments     02/23/13 2142 ED Provider First Contact Nelson Chimes Initial Face to Face Provider Contact          Patient seen by me today 02/24/2013 at 2228    Assessment:  52 y.o., male comes to the ED with c/o right ankle pain ans swelling after mechanical fall. Patient other wise at normal state of health and denies any other injuries.     Differential Diagnosis includes fracture vs sprain         Plan:   1. Pain control  2. Xray of right ankle and tib/fib  3. dispo pending results       Nathanial Rancher, MD    Nathanial Rancher, MD  Resident  02/24/13 [phone]        Patient seen by me on arrival date of 02/23/2013 at 11:25 PM.    History:   I reviewed this patient, reviewed the resident note and agree     Exam:    I examined this patient, reviewed the resident note and agree     Decision Making:   I discussed with the documented resident decision making and agree    ED  Diagnosis:   S/P fall;  fractures of right distal fibula, medial malleolus, and posterior tibial plafond.    Author Hayes Ludwig, MD          Hayes Ludwig, MD  02/28/13 [phone]

## 2013-02-24 NOTE — Anesthesia Post-procedure Eval (Signed)
Anesthesia Post-op Note    Patient: Electronics engineer    Procedure(s) Performed: ORIF right ankle    Anesthesia type: General    Patient location: PACU    Mental Status: Recovered to baseline    Patient able to participate in this evaluation: yes  Last Vitals: BP: 155/96 mmHg (02/24/13 1115)  BP MAP : 109 mmHg (02/24/13 1115)  Heart Rate: 79 (02/24/13 1200)  Temp: 36.2 C (97.2 F) (02/24/13 1200)  Resp: 18 (02/24/13 1200)  Height: 180.3 cm (5' 10.98") (02/23/13 2124)  Weight: 104.327 kg (230 lb) (02/23/13 2124)  BMI (Calculated): 32.2 (02/23/13 2124)  SpO2: 97 % (02/24/13 1200)      Post-op vital signs noted above are within patient's normal range  Post-op vitals signs: stable  Respiratory function: stable on 2L NC oxygen    Airway patent: Yes    Cardiovascular and hydration status stable: Yes    Post-Op pain: Adequate analgesia    Post-Op nausea and vomiting: none    Post-Op assessment: no apparent anesthetic complications, tolerated procedure well and no evidence of recall    Complications: none - received nerve block in PACU at patient's request for 9/10 pain (was consented preoperatively for this).  All questions answered.    Attending Attestation: All indicated post anesthesia care provided    Author: Devin Going, MD  as of: 02/24/2013  at: 12:02 PM

## 2013-02-24 NOTE — Progress Notes (Signed)
Pt. Complaint of pain at 1930 in the right lower extremity and of not being able to wiggle/feel toes.  Nerve block had been administered earlier in the day.  CMS checks WNL.  Ace wrap clean, dry, and intact.  No appearance of drainage or swelling noted. Ortho resident notified and ortho NP notified at 14.  Dilaudid 0.5 mg via IV push administered at 2056 and PRN Norco 2 tablets administered at 2213.  Ortho NP paged a second time at 2210.  Ortho NP and MD in to assess pt. At 2215.  CMS checks continued to be within normal limits throughout the shift.  Ace wrap continues to be clean, dry, and intact.  Ortho NP and MD stated pt.'s leg looked okay, not swollen, the bandage was not too tight, and that he couldn't feel his toes due to the nerve block earlier in the day.  No further orders at this time.  Pt. Stated "Okay that sounds good.  I'm going to try and sleep now."  Will continue to monitor.

## 2013-02-24 NOTE — Consults (Addendum)
Orthopedic Surgery Consult Note    Barry [name]   52 y.o. male  MRN: [id number]   DOA: 02/23/2013       CC: Right ankle pain and deformity    HPI: 52 y.o.  male with hx of DM (diet controlled), HTN, asthma, and schizophrenia (not on meds), presents with R ankle pain and deformity after a ground level fall today. The patient states he was walking to [employer] after work today when he slipped on black ice and fell. He complains of R ankle pain. Denies numbness/tingling. Denies pain elsewhere.       PMH:  Past Medical History   Diagnosis Date   . Asthma    . HTN (hypertension)    . DM (diabetes mellitus)    . Depression    . Back pain        PSH:  History reviewed. No pertinent past surgical history.    Meds:  No current facility-administered medications on file prior to encounter.     Current Outpatient Prescriptions on File Prior to Encounter   Medication Sig Dispense Refill   . metoprolol (LOPRESSOR) 50 MG tablet Take 50 mg by mouth 2 times daily.       . oxycodone-acetaminophen (PERCOCET) 10-325 MG per tablet Take 1 tablet by mouth every 4 hours as needed.           Allergies:  Allergies   Allergen Reactions   . 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 Also states he works at [employer] and today was his first day at work.   Smoking: Denies  Etoh: Denies  Illicit Drugs: Denies    Family History:  Negative for DVT/PE.    ROS:  No CP/SOB/N/V/recent illnesses.      Physical Exam:  Filed Vitals:    02/23/13 2124   BP: 150/104   Pulse: 90   Temp: 36 C (96.8 F)   Resp: 18   Height: 1.803 m (5' 10.98")   Weight: 104.327 kg (230 lb)       General: NAD    Pulm: No respiratory distress, no accessory muscle use.    Cardiac: RRR    BUE: Skin intact with no gross deformities.  No swelling, ecchymosis. No TTP/creptius at the clavicle, shoulder, arm, elbow, forearm, wrist, hand. Gross movement from shoulder distal to digits. Sensation grossly intact.  Digits WWP.    RLE: Skin  intact. Gross deformity at ankle with swelling and TTP. NTTP at hip, thigh, leg, foot. Motor intact grossly. Able to actively flex and extend the toes including the great toe. SILT medial/lateral thigh, medial/lateral leg, 1st dorsal webspace, medial, lateral, plantar, and dorsal foot. 2+ DP pulse, digits WWP. Protective sensation intact to monofilament examination.    LLE: Skin intact, no gross deformity.  No swelling, ecchymosis. NTTP at hip, thigh, leg, ankle, foot. Motor intact grossly. Able to actively dorsiflex and plantarflex the ankle, and flex and extend the toes including the great toe. SILT medial/lateral thigh, medial/lateral leg, 1st dorsal webspace, medial, lateral, plantar, and dorsal foot. 2+ DP pulse, digits WWP. Protective sensation intact to monofilament examination.        Recent Labs  Lab 02/23/13  2207   WBC 7.4   Hemoglobin 13.0*   Hematocrit 39*   Platelets 323       Recent Labs  Lab 02/23/13  2212   Sodium 137   Potassium 4.0   Chloride 101     CO2 26       Recent Labs  Lab 02/23/13  2212   INR 1.1   aPTT 28.6     No results found for this basename: ESR, CRP,  in the last 168 hours      Imaging: Plain XR R ankle demonstrate R trimalleolar ankle fx    Procedure:   Risks and benefits of reduction were discussed with the patient, who provided verbal consent. After gently cleansing the area, a fracture hematoma block was performed using 20 cc of 1% lidocaine. The right ankle was then reduced under c-arm guidance. The RLE was placed into a short leg splint. The patient tolerated the procedure well.       Assessment and Plan:  52 y.o. male, with a R trimalleolar ankle fx  1. WSOR for ORIF R ankle  2. Non-weight bearing RLE  3. Fracture reduced and short leg splint applied  4. Patient today A&O x3, able to repeat risks/benefits of surgery, and was felt to be consentable. Consent obtained from patient and placed in chart.   5. Surgical fixation of fracture   NPO after midnight day before  surgery   IV fluids while NPO   CBC, Chem-7, PT/INR, type and screen   EKG   Chest x-ray   Antibiotics on call to OR  6. DVT prophylaxis: hold pre-operatively    Wenjing Zeng, MD  Orthopaedic Surgery, PGY-2  02/24/2013, at 12:32 AM    I saw and evaluated the patient. I agree with the resident's/fellow's findings and plan of care as documented above. Explained diagnosis and treatment plan to patient. Patient with right trimalleolar ankle fracture indicated for surgical reduction and fixation.  Explained planned procedure including risks/benefits.  Risks include, but are not limited to, bleeding, infection, injury to blood vessels or nerves, painful hardware, hardware failure, nonunion, malunion, need for reoperation, DVT, PE, other unforeseen complications, death. Patient expressed understanding of diagnosis and treatment plan and had questions answered. Plan OR today    Kahne Helfand, MD
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
## 2013-02-24 NOTE — Interval H&P Note (Signed)
UPDATES TO PATIENT'S CONDITION on the DAY OF SURGERY/PROCEDURE    I. Updates to Patient's Condition (to be completed by a provider privileged to complete a H&P, following reassessment of the patient by the provider):    (Inpatients only): I confirm that progress notes within the past 24 hours document updates to the patient's condition.            II. Procedure Readiness   I have reviewed the patient's H&P and updated condition. By completing and signing this form, I attest that this patient is ready for surgery/procedure.      III. Attestation   I have reviewed the updated information regarding the patient's condition and it is appropriate to proceed with the planned surgery/procedure.    Serita Grammes, MD as of 7:13 AM 02/24/2013

## 2013-02-24 NOTE — INTERIM OP NOTE (Addendum)
Interim Op Note    Date of Surgery: 02/24/2013  Surgeon: Judye Bos  First Assistant: Ronney Asters  Second Assistant:     Pre-Op Diagnosis: R bi-mal ankle fx    Anesthesia Type: General    Post-Op Diagnosis:    Primary: As above  Secondary: Same  Tertiary: Same    Additional Findings (Including unexpected complications): NONE    Procedure(s) Performed (including CPT 4 Code if available)   R bi-mal ankle ORIF    Estimated Blood Loss: 25 cc   Packing: No  Drains: No  Fluid Totals: Intakes:Per Anesthesia Outputs: Per Anesthesia  Specimens to Pathology: no  Patient Condition: good      ABX: Ancef X 2 doses post-op  Splint change: Not before discharge  Lovenox in house, ASA 325 mg daily for DC  No return to OR anticipated  NWB R LE    I was present for the entire procedure  Serita Grammes, MD  02/24/2013  3:20 PM
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
## 2013-02-24 NOTE — ED Notes (Signed)
To xray and trauma bay for ankle reduction

## 2013-02-24 NOTE — Progress Notes (Addendum)
Patient:Barry [name]  MRN: [id number]  DOA: 02/23/2013    Ortho Progress Note for 02/24/2013    Events:   No acute events overnight. Pain controlled. Denies CP/SOB/N/V.      Vitals:  Filed Vitals:    02/23/13 2124 02/24/13 0142   BP: 150/104 132/88   Pulse: 90 91   Temp: 36 C (96.8 F) 36.2 C (97.2 F)   TempSrc:  Temporal   Resp: 18 16   Height: 1.803 m (5' 10.98")    Weight: 104.327 kg (230 lb)    SpO2: 99% 98%       Intake/output:  I/O this shift:  03/18 2300 - 03/19 0659  In: 10 (0.1 mL/kg) [I.V.:10]  Out: - (0 mL/kg)   Net: 10  Weight used: 104.3 kg    Labs:    Recent Labs  Lab 02/23/13  2212 02/23/13  2207   WBC  --  7.4   Hematocrit  --  39*   Creatinine 0.82  --    INR 1.1  --    Platelets  --  323   Potassium 4.0  --    Glucose 204*  --          Physical Exam:  NAD, AAOx3  Respirations unlabored    RLE:   - splint C/D/I  - SILT over medial/lateral/dorsal/plantar/1st DWS aspects of foot  - Active plantar/dorsiflexion of all toes  - Toes warm and well perfused      Assessment and Plan:  52 y.o. male admitted on 02/23/2013 with R trimalleolar ankle fx     Analgesia  NPO  Hold chemical DVT ppx pre-operatively  Weight-bearing status: NWB RLE  Abx: Ancef OCTOR  Disposition: WSOR for ORIF R ankle    Governor Specking, MD  Orthopaedic Surgery, PGY-2  02/24/2013  3:08 AM    I saw and evaluated the patient. I agree with the resident's/fellow's findings and plan of care as documented above.    Serita Grammes, MD

## 2013-02-24 NOTE — ED Notes (Signed)
All food and drink removed from pt room. Discussed npo status

## 2013-02-24 NOTE — H&P (Signed)
Orthopedic Surgery Consult Note    Barry [name]   52 y.o. male  MRN: [id number]   DOA: 02/23/2013       CC: Right ankle pain and deformity    HPI: 52 y.o.  male with hx of DM (diet controlled), HTN, asthma, and schizophrenia (not on meds), presents with R ankle pain and deformity after a ground level fall today. The patient states he was walking to [employer] after work today when he slipped on black ice and fell. He complains of R ankle pain. Denies numbness/tingling. Denies pain elsewhere.       PMH:  Past Medical History   Diagnosis Date   . Asthma    . HTN (hypertension)    . DM (diabetes mellitus)    . Depression    . Back pain        PSH:  History reviewed. No pertinent past surgical history.    Meds:  No current facility-administered medications on file prior to encounter.     Current Outpatient Prescriptions on File Prior to Encounter   Medication Sig Dispense Refill   . metoprolol (LOPRESSOR) 50 MG tablet Take 50 mg by mouth 2 times daily.       Marland Kitchen oxycodone-acetaminophen (PERCOCET) 10-325 MG per tablet Take 1 tablet by mouth every 4 hours as needed.           Allergies:  Allergies   Allergen Reactions   . 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 Also states he works at [employer] and today was his first day at work.   Smoking: Denies  Etoh: Denies  Illicit Drugs: Denies    Family History:  Negative for DVT/PE.    ROS:  No CP/SOB/N/V/recent illnesses.      Physical Exam:  Filed Vitals:    02/23/13 2124   BP: 150/104   Pulse: 90   Temp: 36 C (96.8 F)   Resp: 18   Height: 1.803 m (5' 10.98")   Weight: 104.327 kg (230 lb)       General: NAD    Pulm: No respiratory distress, no accessory muscle use.    Cardiac: RRR    BUE: Skin intact with no gross deformities.  No swelling, ecchymosis. No TTP/creptius at the clavicle, shoulder, arm, elbow, forearm, wrist, hand. Gross movement from shoulder distal to digits. Sensation grossly intact.  Digits WWP.    RLE: Skin  intact. Gross deformity at ankle with swelling and TTP. NTTP at hip, thigh, leg, foot. Motor intact grossly. Able to actively flex and extend the toes including the great toe. SILT medial/lateral thigh, medial/lateral leg, 1st dorsal webspace, medial, lateral, plantar, and dorsal foot. 2+ DP pulse, digits WWP. Protective sensation intact to monofilament examination.    LLE: Skin intact, no gross deformity.  No swelling, ecchymosis. NTTP at hip, thigh, leg, ankle, foot. Motor intact grossly. Able to actively dorsiflex and plantarflex the ankle, and flex and extend the toes including the great toe. SILT medial/lateral thigh, medial/lateral leg, 1st dorsal webspace, medial, lateral, plantar, and dorsal foot. 2+ DP pulse, digits WWP. Protective sensation intact to monofilament examination.        Recent Labs  Lab 02/23/13  2207   WBC 7.4   Hemoglobin 13.0*   Hematocrit 39*   Platelets 323       Recent Labs  Lab 02/23/13  2212   Sodium 137   Potassium 4.0   Chloride 101  CO2 26       Recent Labs  Lab 02/23/13  2212   INR 1.1   aPTT 28.6     No results found for this basename: ESR, CRP,  in the last 168 hours      Imaging: Plain XR R ankle demonstrate R trimalleolar ankle fx    Procedure:   Risks and benefits of reduction were discussed with the patient, who provided verbal consent. After gently cleansing the area, a fracture hematoma block was performed using 20 cc of 1% lidocaine. The right ankle was then reduced under c-arm guidance. The RLE was placed into a short leg splint. The patient tolerated the procedure well.       Assessment and Plan:  52 y.o. male, with a R trimalleolar ankle fx  1. [facility] for ORIF R ankle  2. Non-weight bearing RLE  3. Fracture reduced and short leg splint applied  4. Patient today A&O x3, able to repeat risks/benefits of surgery, and was felt to be consentable. Consent obtained from patient and placed in chart.   5. Surgical fixation of fracture   NPO after midnight day before  surgery   IV fluids while NPO   CBC, Chem-7, PT/INR, type and screen   EKG   Chest x-ray   Antibiotics on call to OR  6. DVT prophylaxis: hold pre-operatively    Governor Specking, MD  Orthopaedic Surgery, PGY-2  02/24/2013, at 12:32 AM    I saw and evaluated the patient. I agree with the resident's/fellow's findings and plan of care as documented above. Explained diagnosis and treatment plan to patient. Patient with right trimalleolar ankle fracture indicated for surgical reduction and fixation.  Explained planned procedure including risks/benefits.  Risks include, but are not limited to, bleeding, infection, injury to blood vessels or nerves, painful hardware, hardware failure, nonunion, malunion, need for reoperation, DVT, PE, other unforeseen complications, death. Patient expressed understanding of diagnosis and treatment plan and had questions answered. Plan OR today    Serita Grammes, MD

## 2013-02-25 ENCOUNTER — Ambulatory Visit: Payer: Self-pay | Admitting: Neurosurgery

## 2013-02-25 LAB — POCT GLUCOSE
Glucose POCT: 155 mg/dL — ABNORMAL HIGH (ref 60–99)
Glucose POCT: 152 mg/dL — ABNORMAL HIGH (ref 60–99)
Glucose POCT: 125 mg/dL — ABNORMAL HIGH (ref 60–99)
Glucose POCT: 153 mg/dL — ABNORMAL HIGH (ref 60–99)

## 2013-02-25 MED FILL — Midazolam HCl Inj 2 MG/2ML (Base Equivalent): INTRAMUSCULAR | Qty: 2 | Status: AC

## 2013-02-25 MED FILL — Fentanyl Citrate Inj 0.05 MG/ML: INTRAMUSCULAR | Qty: 5 | Status: AC

## 2013-02-25 MED FILL — Fentanyl Citrate Preservative Free (PF) Inj 100 MCG/2ML: INTRAMUSCULAR | Qty: 2 | Status: AC

## 2013-02-25 NOTE — Progress Notes (Addendum)
Patient:Barry [name]  MRN: [id number]  DOA: 02/23/2013    Ortho Progress Note for 02/25/2013    Events:   No acute events overnight. Patient states that he still is unable to sense toes secondary to nerve block. He was able to move toes after surgery but unable to move toes after nerve block around 8pm      Vitals:  Filed Vitals:    02/24/13 1700 02/24/13 1748 02/24/13 2102 02/25/13 0651   BP:   124/72 135/86   Pulse:   78 84   Temp:    36.6 C (97.9 F)   TempSrc:    Temporal   Resp:  16  18   Height:       Weight:       SpO2: 94%   93%       Intake/output:       Labs:    Recent Labs  Lab 02/23/13  2212 02/23/13  2207   WBC  --  7.4   Hematocrit  --  39*   Creatinine 0.82  --    INR 1.1  --    Platelets  --  323   Potassium 4.0  --    Glucose 204*  --          Physical Exam:  NAD, AAOx3  Respirations unlabored    RLE:   - splint C/D/I  - SILT over medial/lateral/dorsal/plantar/1st DWS aspects of foot  - Active plantar/dorsiflexion of all toes  - Toes warm and well perfused      Assessment and Plan:  52 y.o. male admitted on 02/23/2013 with R trimalleolar ankle fx     Analgesia  Diet regular  Lovenox in house, Aspirin 325BID on discharge  Weight-bearing status: NWB RLE  Ancef 24 hrs periop  PT/OT/OOB  Disposition: pending PT clearance.     Clifton Custard, MD  Orthopaedic Surgery, PGY-2  02/25/2013  6:57 AM    I saw and evaluated the patient. I agree with the resident's/fellow's findings and plan of care as documented above. Dispo to home pending PT clearance.  Follow up 2 weeks Berline Lopes for suture removal.    Serita Grammes, MD

## 2013-02-25 NOTE — Plan of Care (Signed)
Problem: Impaired Ambulation  Goal: LTG - IMPROVE AMBULATION  Patient will ambulate 150-299 feet using a rolling walker with Modified independence    Time frame: 1-3 days

## 2013-02-25 NOTE — Progress Notes (Signed)
Pt resting comfortably in bed with RLE elevated and with ice packs.  CMS checks WNL.  Pt receiving PRN pain medication.  Pt able to ambulate around nurses station using rolling walker and minimal assistance.  Will continue to monitor.

## 2013-02-25 NOTE — Progress Notes (Signed)
PT Evaluation     02/25/13 0900   Prior Living    Prior Living Situation Reported by patient   Lives With Alone  (Pt reports his brother stops by here and there)   # Steps to Enter Home 0   # Of Steps In Home 0   Additional Comments Pt reports there is an elevator in the building.   Prior Function Level   Prior Function Level Reported by patient   Transfers Independent   Walking Independent   Stair negotiation Independent   PT Tracking   PT TRACKING PT Assigned   Visit Number   Visit Number 1   Precautions/Observations   Weight Bearing Status RLE NWB   LDA Observation Other (Comment)  (R ankle cast)   Pain Assessment   *Is the patient currently in pain? X   Pain (Before,During, After) Therapy During   0-10 Scale 10   Pain Location Leg   Pain Orientation Right   Pain Descriptors Constant   Pain Intervention(s) Refer to nursing for pain management;Repositioned   Cognition   Following Commands Follows all commands and directions without difficulty   Safety Judgement Impaired   Additional Comments Pt moved impulsively and too quickly with rolling walker.  Required cues to slow down for safety.   UE Assessment   UE Assessment Full range RUE AROM;Full range LUE AROM   LUE Strength   Overall Strength WNL strength 5/5   RUE Strength   Overall Strength WNL strength 5/5   LE Assessment   LE Assessment Impaired AROM RLE;Full AROM LLE   Strength LLE   Overall Strength WNL strength 5/5   Strength RLE   Overall Strength Deficits   Additional Comments Unable to move R toes.   Bed Mobility   Supine to Sit Independent;Side rails down;Head of bed flat   Sit to Supine Independent;Side rails down;Head of bed flat   Transfers   Sit to Stand Modified independent (device)   Stand to sit Modified independent (device)   Transfer Assistive Device rolling walker   Mobility   Weight Bearing Status RLE   Weight Bearing Status RLE RLE NWB   Gait Pattern Decreased L step length;Increased cadence;Unsteady  (Loss of balance when turning; impulsive  movements)   Ambulation Assist Minimal   Ambulation Distance (Feet) 50   Ambulation Assistive Device rolling walker   Balance   Sitting - Static Independent ;Unsupported   Standing - Static Independent;Supported  (with RW)   Standing - Dynamic Min assist;Supported  (with RW)   Additional Comments   Additional Comments Pt stated he preferred to use RW at home rather than crutches, which is appropriate based on pt's balance.  Pt will need to be modified independent with RW upon discharge.  Educated pt on importance of walking at least 3 more times today with nursing staff, which he is agreeable to doing.  RN aware.   Assessment   Brief Assessment Appropriate for skilled therapy   Problem List Impaired safety/judgement;Impaired endurance;Impaired balance;Impaired ambulation;Pain contributing to impairment   Patient / Family Goal "To go home and do my exercises"   Plan/Recommendation   Treatment Interventions Restorative PT;Balance training;Gait training;Pt/Family education;D/C planning;Will work to minimize pain while promoting mobility whenever possible   PT Frequency 3-5x/wk   Hospital Stay Recommendations Ambulate daily with nursing assist;Out of bed with nursing assist;Home care referral   Discharge Recommendations Anticipate return to prior living arrangement;Home PT   PT Discharge Equipment Recommended Rolling Walker   Assessment/Recommendations Reviewed  With: Patient;Nursing   Time Calculation   PT Untimed Codes 23   PT Total Treatment 23   PT Charges   PT [facility] Charges Eval 30 minutes   Carder Yin L. Cregg, PT, DPT

## 2013-02-25 NOTE — Plan of Care (Signed)
Problem: Mobility  Goal: Patient's functional status is maintained or improved  PT evaluated pt today and determined occasional loss of balances requires the pt to practice walking with staff today and tomorrow with the rolling walker and contact guard assist.

## 2013-02-25 NOTE — Plan of Care (Signed)
Problem: Pain/Comfort  Goal: Patient's pain or discomfort is manageable  Outcome: Maintaining  Pt continues to c/o 10/10 pain, mostly unrelieved by pain medication, repositioning, elevation and ice, although CMS checks are WNL and pt is conversant, appears comfortable and is able to ambulate with minimal assist. MD aware. Will continue to monitor and administer medications as ordered.

## 2013-02-25 NOTE — Op Note (Signed)
PATIENTRHYLEE, [name]  MR #:  [id number]   ACCOUNT #:  [id number] DOB:  04-17-1961    AGE:  51     SURGEON:  Serita Grammes, MD  CO-SURGEON:    ASSISTANT:  Kennyth Lose, MD, RES.  SURGERY DATE:  02/24/2013    PREOPERATIVE DIAGNOSIS:  Right trimalleolar ankle fracture.    POSTOPERATIVE DIAGNOSIS:  Right trimalleolar ankle fracture.    OPERATIVE PROCEDURE:  Open reduction and internal fixation of right trimalleolar ankle fracture.    ANESTHESIA:  General.    SPECIMENS:  None.    ESTIMATED BLOOD LOSS:  25 cc.    IV FLUIDS:  1500 cc of crystalloid.    COMPLICATIONS:  None.    OUTCOME:  Extubated and transferred to PACU in stable condition.    INDICATION FOR PROCEDURE:  Barry [name] is a 52 year old gentleman with diet-controlled diabetes and hypertension who slipped on ice on his way to [employer], twisting his right ankle and falling to the ground.  The patient noted immediate pain, swelling, deformity, and inability to ambulate and presented to the emergency department for evaluation and treatment.  On presentation, he was found to have a right trimalleolar ankle fracture.  He was closed-reduced, placed in a splint, and admitted for operative intervention.  I discussed the planned procedure with the patient in detail including risks and benefits.  Risks include, but are not limited to, bleeding, infection, injury to blood vessels or nerves, painful hardware, hardware failure, nonunion, malunion, need for reoperation, DVT, PE, and other unforeseen complications including death.  The patient expressed understanding of all of the above and had his questions answered.    DESCRIPTION OF PROCEDURE:  The patient was brought into the operating room and placed in supine position on a radiolucent table with a large bump under the right hip.  The right lower extremity was prepped and draped in the usual standard sterile surgical fashion.  A timeout was performed to identify the correct patient, side and site of surgery,  administration of preoperative antibiotics, and availability of all equipment and implants.  We proceeded first by palpating the fibula and marking out our surgical incision over the posterolateral aspect of the fibula.  A #10 blade was used to incise the skin and sharp dissection was carried out through skin and subcutaneous fat down onto the periosteum of the fibula.  The periosteum was elevated to expose the fracture site, which was a long oblique fracture.  The fracture site was exposed and cleaned out and then held in a reduced position with 2 pointed reduction forceps.  We then applied a 2.7 mm cortical lag screw from an anterior and posterior direction to secure the fracture site and we were able to remove our clamps.  We then placed a 7-hole, 1/3 tubular plate on the lateral aspect of the fibula as a neutralization plate, applying 3.5 mm cortical screws proximally and 3.5 and 4.0 cancellous screws distally with 3 points of fixation on either side of the fracture site.  We then turned our attention to fixation of the medial malleolus.  A small incision was placed inline with the saphenous vein and nerve over the medial malleolus centered on the fracture site.  A #10 blade was used to incise the skin and sharp dissection was carried out through skin and subcutaneous fat down onto the periosteum of the medial malleolus.  The periosteal tissue was removed from inside the fracture site and this was reduced using a dental clamp  and held in provisional fixation with a 1.25 mm K-wire.  Two 2.0 mm drills were placed through the fragment and inspected on the AP, mortise, and lateral radiographs to ensure appropriate position and reduction of the fracture.  We then measured and replaced these with 2.7 mm fully-threaded cortical screws.  We then removed our provisional K-wire and noted an anatomic reduction of the medial fragment with appropriate hardware placement.  Final x-rays including AP, lateral, and mortise  projections demonstrated satisfactory reduction with appropriate application of all hardware.  We performed a Cotton test, and this demonstrated no evidence of syndesmotic instability.  We then copiously irrigated both wounds, reprepped the skin with Betadine, and performed our deep closure with a 2-0 Vicryl in interrupted fashion followed by skin closure with 3-0 nylon in a mattress fashion.  Xeroform, dry sterile dressing, and a short-leg splint were then applied.  The patient tolerated this well without complication and was transferred to the PACU in stable condition.             ______________________________  Serita Grammes, MD    GS/MODL  DD:  02/24/2013 09:44:51  DT:  02/24/2013 10:17:56  Job #:  [id number]    cc: Serita Grammes, MD

## 2013-02-25 NOTE — Plan of Care (Signed)
Problem: Psychosocial  Goal: Demonstrates ability to cope with illness  Outcome: Maintaining  Pt is conversant, talking on the phone and has frequent visitors. He reports his pain 10/10 at all times, and has occasional difficulty managing the "paralyzation" of his right foot, due to the nerve block he received yesterday in the PACU. MD is aware.

## 2013-02-25 NOTE — Progress Notes (Signed)
Pt is overjoyed as he can now feel and move the toes on his right foot. CMS checks WNL. Pt took Norco PRN x2 today and was able to successfully ambulate x2 with assist of staff and the rolling walker.

## 2013-02-26 LAB — POCT GLUCOSE
Glucose POCT: 154 mg/dL — ABNORMAL HIGH (ref 60–99)
Glucose POCT: 160 mg/dL — ABNORMAL HIGH (ref 60–99)
Glucose POCT: 168 mg/dL — ABNORMAL HIGH (ref 60–99)
Glucose POCT: 161 mg/dL — ABNORMAL HIGH (ref 60–99)

## 2013-02-26 MED ORDER — BISACODYL EC 5 MG PO TBEC *I*
10.0000 mg | DELAYED_RELEASE_TABLET | Freq: Every day | ORAL | Status: DC | PRN
Start: 2013-02-26 — End: 2013-02-27
  Administered 2013-02-26: 10 mg via ORAL
  Filled 2013-02-26: qty 2

## 2013-02-26 MED ORDER — ACETAMINOPHEN 500 MG PO TABS *I*
1000.0000 mg | ORAL_TABLET | Freq: Three times a day (TID) | ORAL | Status: DC
Start: 2013-02-26 — End: 2013-02-27
  Administered 2013-02-26 – 2013-02-27 (×3): 1000 mg via ORAL
  Filled 2013-02-26 (×5): qty 2

## 2013-02-26 MED ORDER — OXYCODONE HCL 5 MG PO TABS *I*
10.0000 mg | ORAL_TABLET | ORAL | Status: DC | PRN
Start: 2013-02-26 — End: 2013-02-27
  Administered 2013-02-26 – 2013-02-27 (×6): 10 mg via ORAL
  Filled 2013-02-26 (×5): qty 2

## 2013-02-26 MED ORDER — ZOLPIDEM TARTRATE 5 MG PO TABS *I*
5.0000 mg | ORAL_TABLET | Freq: Every evening | ORAL | Status: DC | PRN
Start: 2013-02-26 — End: 2013-02-27

## 2013-02-26 MED ORDER — OXYCODONE HCL 5 MG PO TABS *I*
5.0000 mg | ORAL_TABLET | ORAL | Status: DC | PRN
Start: 2013-02-26 — End: 2013-02-27
  Filled 2013-02-26: qty 2

## 2013-02-26 NOTE — Progress Notes (Signed)
1030 Pt c/o increased pain, as if the splint is too tight, unrelieved by available PRN medication.  Orthopedic on-call resident paged.  Awaiting response.

## 2013-02-26 NOTE — Progress Notes (Signed)
Patient:Barry [name]  MRN: [id number]  DOA: 02/23/2013    Ortho Progress Note for 02/26/2013    Events:   Pain increased once ankle block wore off, uncomfortable overnight.  OOB and ambulating yesterday.  Tolerating diet, denies N/V.     Vitals:  Filed Vitals:    02/25/13 0651 02/25/13 1310 02/25/13 1950 02/26/13 0633   BP: 135/86 128/75 160/82 144/79   Pulse: 84 75 90 77   Temp: 36.6 C (97.9 F) 37.1 C (98.8 F) 36.4 C (97.5 F) 36.6 C (97.9 F)   TempSrc: Temporal Temporal Temporal Temporal   Resp: 18 18 18 18    Height:       Weight:       SpO2: 93% 91% 91% 95%       Labs:    Recent Labs  Lab 02/23/13  2212 02/23/13  2207   WBC  --  7.4   Hematocrit  --  39*   Creatinine 0.82  --    INR 1.1  --    Platelets  --  323   Potassium 4.0  --    Glucose 204*  --          Physical Exam:  NAD, AAOx3  Respirations unlabored    RLE:   - splint C/D/I, loosened ACE and webril around ankle, patient noted pain relief  - SILT over medial/lateral/dorsal/plantar/1st DWS aspects of foot  - Active plantar/dorsiflexion of all toes  - Toes warm and well perfused      Assessment and Plan:  52 y.o. male admitted on 02/23/2013 with R trimalleolar ankle fx     Analgesia  Diet regular  Lovenox in house, Aspirin 325BID on discharge  Weight-bearing status: NWB RLE  PT/OT/OOB  Disposition: likely home tomorrow, pending PT clearance.     Dorthea Cove, MD  Orthopedic Surgery, PGY-1  02/26/2013 7:44 AM  Pager [phone]

## 2013-02-26 NOTE — Progress Notes (Signed)
Social Work Contact:    Genevie Cheshire, Artist; Patient Relations     SW spoke with Kennith Center regarding patient's earlier request to look into matter about Insurance coverage by [employer] where he slipped and fell. Per Kennith Center, her Supervisor Elnita Maxwell has followed up with patient regarding his liability claim and that process.     Patient had asked to speak with someone in Patient Relations today; apparently his concern was related to a former problem he had been having with [facility] regarding his transportation. It sounded as if he expected to have Medicaid cab set up for him through the [facility] but for some reason this was not working out.     He wished to make sure he would have discharge transportation assistance from the hospital.     Please page weekend SW for assistance with discharge transportation if patient is ready.

## 2013-02-26 NOTE — Progress Notes (Signed)
Physical Therapy Progress Note   02/26/13 0908   Visit Number   Visit Number 2   Precautions/Observations   Precautions used x   Weight Bearing Status RLE NWB   Pain Assessment   *Is the patient currently in pain? X   Pain (Before,During, After) Therapy During   0-10 Scale 10   Pain Location Leg   Pain Orientation Right   Pain Descriptors Tightness;Pressure   Pain Intervention(s) Refer to nursing for pain management   Additional comments Patient complaining of pain due to tightness of splint - able to wiggle toes and toes warm. Resident informed and will come to look at splint again (was loosened this morning).   Cognition   Additional Comments Patient focused on pain in leg and unable to fully discuss other topics.   Bed Mobility   Bed mobility x   Sit to Supine Independent;Head of bed flat;Side rails up (#)   Transfers   Transfers x   Sit to Stand Modified independent (device)   Stand to sit Modified independent (device)   Transfer Assistive Device rolling walker   Additional comments x2 reps   Mobility   Mobility X   Weight Bearing Status RLE   Weight Bearing Status RLE RLE NWB   Ambulation Assist Modified independent (device)   Ambulation Distance (Feet) 15;20   Ambulation Assistive Device rolling walker   Additional comments Patient refusing further mobility due to pain.   Balance   Balance x   Sitting - Static Independent ;Supported   Standing - Static Independent;Supported   Standing - Dynamic Independent;Supported   Additional Comments No loss of balance or unsteadiness noted.   Assessment   Brief Assessment Remains appropriate for skilled therapy   Plan/Recommendation   Treatment Interventions Restorative PT   PT Frequency 3-5x/wk   Hospital Stay Recommendations Out of bed with nursing assist;Ambulate daily with nursing assist   Discharge Recommendations Anticipate return to prior living arrangement;Home PT   PT Discharge Equipment Recommended Rolling Walker  (crutches if able to assess stairs)    Assessment/Recommendations Reviewed With: Patient;Nursing;Social Worker   Additional Recommendations Will continue to follow to progress ambulation and assess stairs (patient has 6 steps to enter his workplace). The stairs are not necessary for discharge as he does not need to do them to return home. Currently limited by pain. Patient asking for prescription for rollator - discussed that  a rollator is not appropriate for this situation as it is not safe to use to maintain NWB RLE.   Time Calculation   PT Timed Codes 11   PT Untimed Codes 0   PT Unbilled Time 2   PT Total Treatment 11   PT Charges   PT [facility] Charges Gait Training (15 min)   Deniece Portela, PT  Pager # [phone]

## 2013-02-27 LAB — POCT GLUCOSE
Glucose POCT: 148 mg/dL — ABNORMAL HIGH (ref 60–99)
Glucose POCT: 192 mg/dL — ABNORMAL HIGH (ref 60–99)

## 2013-02-27 MED ORDER — OXYCODONE HCL 10 MG PO TABS *I*
10.0000 mg | ORAL_TABLET | ORAL | Status: DC | PRN
Start: 2013-02-27 — End: 2013-05-18

## 2013-02-27 MED ORDER — ACETAMINOPHEN 500 MG PO TABS *I*
1000.0000 mg | ORAL_TABLET | Freq: Three times a day (TID) | ORAL | Status: AC
Start: 2013-02-27 — End: 2013-03-29

## 2013-02-27 MED ORDER — ASPIRIN 325 MG PO TABS *I*
325.0000 mg | ORAL_TABLET | Freq: Two times a day (BID) | ORAL | Status: AC
Start: 2013-02-27 — End: 2013-03-29

## 2013-02-27 NOTE — Progress Notes (Signed)
Patient referred to [facility] for homecare services under [facility].  SOC scheduled for 02/28/13

## 2013-02-27 NOTE — Progress Notes (Addendum)
Patient:Barry [name]  MRN: [id number]  DOA: 02/23/2013    Ortho Progress Note for 02/27/2013    Events:   Pain controlled  OOB and ambulating yesterday.  Tolerating diet, denies N/V.     Vitals:  Filed Vitals:    02/26/13 1559 02/26/13 2238 02/27/13 0237 02/27/13 0730   BP: 147/93 121/79 131/80 115/69   Pulse: 81 78 75 77   Temp: 36.3 C (97.3 F) 36.4 C (97.5 F) 36.5 C (97.7 F) 36.1 C (97 F)   TempSrc: Temporal Temporal Temporal Temporal   Resp: 18 16 16 16    Height:       Weight:       SpO2: 98% 97% 94% 94%       Labs:    Recent Labs  Lab 02/23/13  2212 02/23/13  2207   WBC  --  7.4   Hematocrit  --  39*   Creatinine 0.82  --    INR 1.1  --    Platelets  --  323   Potassium 4.0  --    Glucose 204*  --          Physical Exam:  NAD, AAOx3  Respirations unlabored    RLE:   - splint C/D/I, loosened ACE and webril around ankle, patient noted pain relief  - SILT over medial/lateral/dorsal/plantar/1st DWS aspects of foot  - Active plantar/dorsiflexion of all toes  - Toes warm and well perfused      Assessment and Plan:  52 y.o. male admitted on 02/23/2013 with R trimalleolar ankle fx     Analgesia  Diet regular  Lovenox in house, Aspirin 325BID on discharge  Weight-bearing status: NWB RLE  PT/OT/OOB  Disposition: Home today    Kennyth Lose, MD  Orthopedic Surgery  02/27/2013 8:41 AM      Orthopaedic Trauma Attending    I saw and examined the patient.  I have reviewed the resident's note and agree with their finding and plan as documented above.    Sharlene Motts, MD

## 2013-02-27 NOTE — Progress Notes (Signed)
Physical Therapy Discharge Note:     02/27/13 1400   PT Tracking   PT TRACKING PT Discontinue   Visit Number   Visit Number 0   Assessment   Brief Assessment Patient demonstrates adequate mobility skills to return home   Plan/Recommendation   Treatment Interventions No further PT interventions   PT Frequency none further   Hospital Stay Recommendations Ambulate daily with nursing assist;Out of bed with nursing assist;May be out of bed with family assist;May ambulate with family assist   Discharge Recommendations Anticipate return to prior living arrangement     Elpidio Anis DPT  Pager # [phone]

## 2013-02-27 NOTE — Discharge Instructions (Addendum)
ORTHOPAEDIC SURGERY DISCHARGE PLAN      DATE: 02/27/2013     Procedure: Ankle ORIF              Attending Surgeon: Sallee Lange       FOR AMBULATORY SURGICAL PATIENTS:  You have received sedative medication and/or general anesthesia which may make you drowsy for as long as 24 hours.     A.) DO NOT drive or operate any machinery for 24 hours    B.) DO NOT drink alcoholic beverages for 24 hours    C.) DO NOT make major decisions, sign contracts, etc. for 24 hours    DIET: Resume your previous diet    ACTIVITY: Rest today;  No heavy lifting, exercise, or sexual activity until cleared by surgeon.  No driving if taking narcotic pain medications.  activity as tolerated NWB operative extremity    CARE OF DRESSING OR INCISION: Do NOT remove dressing for  If there is tissue glue on your wound, do not pick at this layer.     BATHING/SHOWERING: Do not get splint wet    DRAINS: If you have JP drains, please empty and record the volume of fluid drained twice daily.  Bring a record of the output to your clinic appointment.  If you have no drains, disregard this section.    OTHER: None    PAIN MANAGEMENT RECOMMENDATION: Assess your level of pain on a scale from 0 to 10.      You should take narcotic pain medication if your pain level is greater than three (3) - be sure to eat prior to taking the medication. If your physician has prescribed non-narcotic pain medication, please take as directed.    New Medications Ordered: PO pain pills, ASA 325 mg     FOLLOW-UP CARE    See After Visit Summary for further follow-up information.    Call your doctor for the following problems: FEVER over 101 F/38.4 C; PAIN not relieved with pain medication ordered; SWELLING, REDNESS AT INCISION SITE, HEAVY BLEEDING, FOUL DRAINAGE, INABILITY TO URINATE.     If your operation was at [facility] and you have difficulty contacting the doctor call [facility] at [phone], weekdays between 8:00 AM and 11:00 PM. At other times, call the Emergency  Department at [phone] 24 hours a day.    If your operation was at [facility] and you are unable to contact your doctor please call or go to [facility] Emergency Department [phone]    Additional written instructions provided to patient: no  Specify: None    Signed:  Kennyth Lose, MD on 02/27/2013 at 9:11 AM    Smoking    Smoking can increase your chances of developing chronic health problems or worsen conditions you already have.  If you smoke you should quit. Smoking cessation information has been given to you for your review to help you quit.  Medications to help you quit are available.  Ask your doctor if you would like to receive these medications.       Medications  Your doctor has prescribed medications to improve or manage your condition (such as antibiotics, pain medications, ACE inhibitors, beta blockers, diuretics and aspirin).  You should take them as prescribed by your doctor.  Ask your doctor for any questions regarding these medications.      Diet  A healthy diet is important to help you stay well.  Some health conditions require you to be on a special diet. For example, if you  have heart failure you should monitor your fluid intake and limit the amount of sodium including table salt.  This will help you avoid fluid retention which can cause shortness of breath or swelling of the feet and ankles.  Reading food labels is helpful when you are on a special diet.  Follow instructions from your doctor for any other special dietary requirement.    Exercise/Activity  Activity and exercise are important to your well being.  While you are in the hospital your activity may be restricted.  As your condition improves your activity level will be increased.  Most patients will be able to gradually resume activity as before.  You should follow your doctor's activity recommendations      Daily Weight  For some conditions (such as heart failure) you will need to weigh yourself every day at the same  time on the same scale, preferably after you empty your bladder.  If you have an increase of 3 pounds in 2 days or 5 pounds in 1 week you should contact your physician.  A daily written log of your weights is a good way to keep track.  You should follow your doctor's recommendations on monitoring your weight.     What to do if your condition changes?  Once you leave the hospital you should contact your doctor's office to make a follow up appointment.  If at any time you have any questions or concerns or your condition gets worse, contact your physician.  If you can not reach your physician or you develop life threatening symptoms such as trouble breathing or chest pain you should go to the closest Emergency Department.     Education  You may receive additional information on your specific condition before you are discharged.  Please ask your nurse or doctor for your information before you leave the hospital.

## 2013-02-27 NOTE — Progress Notes (Signed)
PT Treatment Note:  * Physical Therapist to review for discharge planning *     02/27/13 0933   Visit Number   Visit Number 3   Precautions/Observations   Precautions used x   Weight Bearing Status RLE NWB   Pain Assessment   *Is the patient currently in pain? X   Pain (Before,During, After) Therapy During   0-10 Scale 6   Pain Location Leg   Pain Orientation Right   Pain Descriptors Aching   Pain Intervention(s) Refer to nursing for pain management;Repositioned   Bed Mobility   Bed mobility x   Supine to Sit Independent;Side rails down;Head of bed flat   Sit to Supine Independent;Side rails down;Head of bed flat   Transfers   Transfers x   Sit to Stand Modified independent (device)  (from EOB)   Stand to sit Modified independent (device)   Transfer Assistive Device rolling walker   Mobility   Mobility X   Weight Bearing Status RLE RLE NWB   Ambulation Assist Modified independent (device)   Ambulation Distance (Feet) 150   Ambulation Assistive Device rolling walker   Additional comments No LOB noted t/o amb, pt moving slow but steady while maintaining NWB R LE. @ completion of session pt w/o questions or concerns regarding mobility before d/c home.    Balance   Balance x   Sitting - Static Independent ;Unsupported   Standing - Static Independent;Unsupported   Standing - Dynamic Independent;Supported   Assessment   Brief Assessment Patient demonstrates adequate mobility skills to return home   Plan/Recommendation   PT Frequency none further   Discharge Recommendations Anticipate return to prior living arrangement   PT Discharge Equipment Recommended None  (RW already delivered to pt)   Assessment/Recommendations Reviewed With: Patient;Nursing   Time Calculation   PT Timed Codes 23  (8 min functional; gait)   PT Untimed Codes 0   PT Unbilled Time 0   PT Total Treatment 23   PT Charges   PT [facility] Charges Functional Training (15 min);Gait Training (15 min)   Hooria Gasparini M. Hansel Starling, [place]  Pager [phone]

## 2013-03-01 ENCOUNTER — Telehealth: Payer: Self-pay | Admitting: Orthopedic Surgery
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
## 2013-03-02 ENCOUNTER — Encounter: Payer: Self-pay | Admitting: Orthopedic Surgery

## 2013-03-11 ENCOUNTER — Telehealth: Payer: Self-pay | Admitting: Orthopedic Surgery

## 2013-03-11 ENCOUNTER — Other Ambulatory Visit: Payer: Self-pay | Admitting: Surgery

## 2013-03-11 DIAGNOSIS — S82891D Other fracture of right lower leg, subsequent encounter for closed fracture with routine healing: Secondary | ICD-10-CM

## 2013-03-11 NOTE — Telephone Encounter (Signed)
Patient called complaining that [facility] had not been to see him. Per Dr. Sallee Lange, patient is not on Lovenox and has no nursing needs. Patient states he does not have transportation to/from appointments/physical therapy. He says [facility] told him he cannot receive home PT until [facility] is able to come out and evaluate him. As of 03/05/13, [facility] was unable to reach him by phone and he was not able to be located.     The patient informed me this morning that the [facility] address that was in the system is his mailing address (a church) and that he lives at another location. I explained to him that we would need his home address as we would have no way of knowing that the previous given address was only a mailing address. I updated his account with his home address of [address] [place], [place] [address].    Spoke to [facility], because he does not require any skilled needs, they cannot open a case for him. I will try calling the [facility] transportation line [phone]) to set up transportation so that he is able to make appointments/PT.     Called patient [phone]) and explained the situation; I told him I would call [facility] transportation.     Spoke to [place] [facility]; the patient has a coinsurance plan with a deductible and does not cover transportation.     Called patient back and told him that his insurance does not cover transportation; he asked for the address to [facility] [address] and the bus that he can take out there. I started explaining and he said "I know how to get there" and hung up on me.

## 2013-03-12 ENCOUNTER — Ambulatory Visit: Payer: Self-pay | Admitting: Surgery

## 2013-03-12 ENCOUNTER — Ambulatory Visit: Payer: Self-pay

## 2013-03-12 ENCOUNTER — Encounter: Payer: Self-pay | Admitting: Surgery

## 2013-03-12 VITALS — BP 132/80 | Ht 71.0 in | Wt 230.0 lb

## 2013-03-12 DIAGNOSIS — S8291XD Unspecified fracture of right lower leg, subsequent encounter for closed fracture with routine healing: Secondary | ICD-10-CM

## 2013-03-12 DIAGNOSIS — S82891D Other fracture of right lower leg, subsequent encounter for closed fracture with routine healing: Secondary | ICD-10-CM

## 2013-03-12 NOTE — Progress Notes (Signed)
Walk-in form  Patient name: Barry [name]     Pain    03/12/13 1352   PainSc:   0 - No pain         Laterality: right    Device: fracture boot         Function of Device: limit motion     ROM settings: Not Applicable      Brand: procare             Model: maxtrax walker    Size: M    Modifications made: none    Ordered/Delivered: del    The patient given verbal and written instructions.

## 2013-03-12 NOTE — Patient Instructions (Signed)
[facility]      Your physician has determined that you require Prefabricated (off-the-shelf) Orthotic and Prosthetic (O&P) devices and/or Durable Medical Equipment (DME).  O&P devices include items such as braces for the spine or limbs, while DME includes items such as canes, crutches and walkers.  For your convenience you were fitted by the [facility].    Return Policy:    Prefabricated O&P and DME items are not returnable if used outside of clinic due to hygiene concerns.    Special or custom orders are not returnable or refundable.    O&P devices and DME purchased from the [facility] may only be exchanged if the item is faulty or poorly fitting, as determined by the O&P Department Clinical Coordinator.  Exchanges will be made using the same type of device, if within 7 days of the purchase date.    No exchange will be issued for items that were not used in accordance with the manufacturer’s recommended standard of care.    Replacement or repair of minor parts could become necessary due to wear.  This may include a charge and an order from you physician may be required.

## 2013-03-12 NOTE — Progress Notes (Signed)
S/P Open reduction and internal fixation of right trimalleolar ankle fracture 02/24/13   He has abided by restrictions.  He has weaned off the oxycodone.  He continues to keep the right lower extremity elevated and continues aspirin and 325 mg BID.  Denies fevers or chills. Denies numbness or tingling in the right lower extremity.  No other concerns today.    Exam: Incisions C/D/I with suture. No erythema or drainage.  Swelling about the ankle.  Calf is soft.  Appropriate sensation to light touch to the right  Lower extremity. Brisk capillary refill.    Xrays: demonstrate acceptable alignment of fracture. All hardware  Remains in position.    Plan: stitches removed.  He is placed in a high tide walker boot.  He can remove this for gentle ankle range of motion and showering.    Remain nonweightbearing on the right lower extremity.  Continue aspirin 325mg  BID. FU in 4 weeks.  I am ordering right ankle. Patient agrees with plan.  Will FU sooner with any new concerns or complaints.

## 2013-03-14 NOTE — Discharge Summary (Signed)
Discharge Summary       Admit date: 02/23/2013         Discharge date and time: 02/27/2013  Admitting Physician: Jarrett Ables, MD   Discharge Attending: Sallee Lange    Patient: Barry [name] Age: 52 y.o. Date of Birth: [birth year] [id number]    Chief Complaint: Right Ankle Fracture  Principal Problem: <principal problem not specified>    Details of Admission: as per admission H&P    Discharge Diagnoses:  Active Hospital Problems    Diagnosis   . Ankle fracture      Resolved Hospital Problems    Diagnosis   No resolved problems to display.         Hospital Course (including key diagnostic test results):    The patient was admitted to [facility] with a R ankle fracture. He underwent ORIF on 02/23/2013 by Dr. Serita Grammes. He tolerated the procedure well and passed PT and achieved adequate pain control in house. He had DVT ppx of Levnox in house and [name] 325 mg BID on discharge.     Key Exam Findings at Discharge:    Vitals: Blood pressure 115/69, pulse 77, temperature 36.1 C (97 F), temperature source Temporal, resp. rate 16, height 1.803 m (5' 10.98"), weight 104.327 kg (230 lb), SpO2 94.00%.    Admission Weight: Weight: 104.327 kg (230 lb)  Discharge Weight: Weight: 104.327 kg (230 lb)       Pending Test Results: None    Consulting Providers: none    Discharged Condition: good    Discharge medications, instructions, and follow-up plans: as per After Visit Summary  Disposition: Home with [facility]      Signed: Kennyth Lose, MD  On: 03/14/2013  at: 12:54 PM

## 2013-04-06 ENCOUNTER — Encounter: Payer: Self-pay | Admitting: Orthopedic Surgery

## 2013-04-06 ENCOUNTER — Ambulatory Visit: Payer: Self-pay | Admitting: Orthopedic Surgery

## 2013-04-06 VITALS — BP 138/86 | Ht 71.0 in | Wt 230.0 lb

## 2013-04-06 DIAGNOSIS — S82899A Other fracture of unspecified lower leg, initial encounter for closed fracture: Secondary | ICD-10-CM

## 2013-04-06 NOTE — Progress Notes (Signed)
CC: Patient presents for follow up 6 weeks s/p R ankle ORIF for tri-mal fx    HPI: Barry [name] 52 y.o. male  is here for follow up of above injury. He is doing well. He DC'ed his HTWB yesterday and started wearing normal shoes and has put some weight on it.       Physical Exam:  Constitutional-Healthy, well appearing, NAD.  Blood pressure 138/86, height 1.803 m (5\' 11" ), weight 104.327 kg (230 lb).  Musculoskeletal-      R LE: mild swelling about the ankle. Cap refill < 3 sec all exposed toes. SILT M/L/P/P/1st DWS, + EHL. +ADF/APF.     Imaging:  I personally reviewed the images. Overall acceptable alignment of hardware.     Assessment:       52 y/o M 6 weeks s/p ORIF R ankle    WBAT  DC HTWB  RTC in 6 weeks- new x-rays of the R ankle at that time         Kennyth Lose, MD  11:50 AM  04/06/2013      I saw and evaluated the patient. I agree with the resident's/fellow's findings and plan of care as documented above.    Serita Grammes, MD

## 2013-04-19 ENCOUNTER — Encounter (HOSPITAL_COMMUNITY): Payer: Self-pay | Admitting: Emergency Medicine

## 2013-04-19 ENCOUNTER — Emergency Department (INDEPENDENT_AMBULATORY_CARE_PROVIDER_SITE_OTHER)
Admission: EM | Admit: 2013-04-19 | Discharge: 2013-04-19 | Disposition: A | Discharge status: Home / Self Care | Payer: Medicare Other | Source: Home / Self Care | Attending: Family Medicine | Admitting: Family Medicine

## 2013-04-19 ENCOUNTER — Emergency Department (INDEPENDENT_AMBULATORY_CARE_PROVIDER_SITE_OTHER): Payer: Medicare Other

## 2013-04-19 ENCOUNTER — Telehealth: Payer: Self-pay | Admitting: Orthopedic Surgery

## 2013-04-19 DIAGNOSIS — M25579 Pain in unspecified ankle and joints of unspecified foot: Secondary | ICD-10-CM

## 2013-04-19 DIAGNOSIS — M25571 Pain in right ankle and joints of right foot: Secondary | ICD-10-CM

## 2013-04-19 MED ORDER — DIFLUNISAL 500 MG PO TABS: 500.0000 mg | ORAL_TABLET | Freq: Two times a day (BID) | ORAL | Status: DC

## 2013-04-19 NOTE — Telephone Encounter (Signed)
He's beyond 6 wks so no narcotics. He will have to try extra strength Tylenol or alleve or something like that.     On Apr 19, 2013, at 8:38 AM, "Willeen Cass, Chancy Claros" @[email or web address]> wrote:  Patient says he is in [place] until 5/21 and is requesting something stronger than the Ibuprofen (he says they are not working).

## 2013-04-19 NOTE — ED Provider Notes (Signed)
History     CSN: [id number]  Arrival date & time 04/19/13  1545   First MD Initiated Contact with Patient 04/19/13 1642      Chief Complaint  Patient presents with  . Ankle Pain    (Consider location/radiation/quality/duration/timing/severity/associated sxs/prior treatment) Patient is a 51 y.o. male presenting with ankle pain. The history is provided by the patient.  Ankle Pain Location:  Ankle Time since incident:  2 months Injury: yes   Mechanism of injury comment:  S/p fx and surg, , c/o pain persisting, out of percocet since wed. Ankle location:  R ankle Chronicity:  New   History reviewed. No pertinent past medical history.  Past Surgical History  Procedure Laterality Date  . Orthopedic surgery      No family history on file.  History  Substance Use Topics  . Smoking status: Never Smoker   . Smokeless tobacco: Not on file  . Alcohol Use: No      Review of Systems  Constitutional: Negative.   Musculoskeletal: Positive for joint swelling and gait problem.  Skin: Negative.     Allergies  Review of patient's allergies indicates no known allergies.  Home Medications   Current Outpatient Rx  Name  Route  Sig  Dispense  Refill  . diflunisal (DOLOBID) 500 MG TABS   Oral   Take 1 tablet (500 mg total) by mouth 2 (two) times daily.   30 tablet   1     BP 147/87  Pulse 80  Temp(Src) 98.6 F (37 C) (Oral)  Resp 18  SpO2 99%  Physical Exam  Nursing note and vitals reviewed. Constitutional: He is oriented to person, place, and time. He appears well-developed and well-nourished.  Musculoskeletal: He exhibits tenderness.       Right ankle: He exhibits decreased range of motion and swelling. He exhibits normal pulse. Achilles tendon normal.  Neurological: He is alert and oriented to person, place, and time.  Skin: Skin is warm and dry.    ED Course  Procedures (including critical care time)  Labs Reviewed - No data to display Dg Ankle Complete  Right  04/19/2013  *RADIOLOGY REPORT*  Clinical Data: Ankle pain  RIGHT ANKLE - COMPLETE 3+ VIEW  Comparison: None.  Findings: Three views of the right ankle submitted.  Metallic fixation screws are noted in distal tibia medial malleolus. Metallic plate and fixation screws are noted in the distal fibula. There is anatomic alignment.  No acute fracture or subluxation.  IMPRESSION: No acute fracture or subluxation.  Postsurgical changes distal tibia and fibula.   Original Report Authenticated By: Natasha Mead, M.D.      1. Pain in joint of right ankle       MDM  X-rays reviewed and report per radiologist.         Linna Hoff, MD 04/19/13 1721
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
## 2013-05-13 ENCOUNTER — Other Ambulatory Visit: Payer: Self-pay | Admitting: Surgery

## 2013-05-18 ENCOUNTER — Ambulatory Visit: Payer: Self-pay | Admitting: Orthopedic Surgery

## 2013-05-18 ENCOUNTER — Encounter: Payer: Self-pay | Admitting: Orthopedic Surgery

## 2013-05-18 VITALS — BP 138/86 | Ht 71.0 in | Wt 220.0 lb

## 2013-05-18 DIAGNOSIS — S82899A Other fracture of unspecified lower leg, initial encounter for closed fracture: Secondary | ICD-10-CM

## 2013-05-18 MED ORDER — NAPROXEN 500 MG PO TABS *I*
500.0000 mg | ORAL_TABLET | Freq: Two times a day (BID) | ORAL | Status: AC
Start: 2013-05-18 — End: 2013-06-17

## 2013-05-18 NOTE — Progress Notes (Signed)
CC: Patient presents for follow up of his right ankle ORIF on 02/24/13    HPI: Barry [name] 52 y.o. male  is here for follow up of his right ankle ORIF.  He has been WB as tolerated, still having some swelling, and requests more narcotic pain medication today.  He denies fevers, chills or other symptoms.      Physical Exam:  Constitutional-Healthy, well appearing, NAD.  Blood pressure 138/86, height 1.803 m (5\' 11" ), weight 99.791 kg (220 lb).  Musculoskeletal-  Focused exam of his right ankle shows some mild swelling, intact and benign appearing incisions, he has intact motor strength with ankle dorsiflexion and plantarflexion, sensation is intact over his dorsal and plantar foot, his foot is warm and well perfused.      Imaging:  I personally reviewed the images.  xrays of the right ankle show intact hardware and healing of the fracture    Assessment: 52 yo male s/p right ankle ORIF, now 3 months out.  We discussed today that we are not going to provide him with any more narcotics, but offered him a prescription of naproxen today.  He should continue with PT, follow up in 3 months for repeat xrays.    Plan: RTC in 3 months, continue PT, xrays of right ankle     Wallie Char MD  Orthopaedic Surgery Resident    I saw and evaluated the patient. I agree with the resident's/fellow's findings and plan of care as documented above.    Serita Grammes, MD

## 2013-05-26 ENCOUNTER — Ambulatory Visit: Payer: Self-pay | Admitting: Neurosurgery

## 2013-06-18 ENCOUNTER — Other Ambulatory Visit: Payer: Self-pay | Admitting: Surgery

## 2013-06-22 ENCOUNTER — Ambulatory Visit: Payer: Self-pay | Admitting: Orthopedic Surgery

## 2013-07-13 ENCOUNTER — Other Ambulatory Visit: Payer: Self-pay | Admitting: Primary Care

## 2013-07-22 ENCOUNTER — Ambulatory Visit: Admit: 2013-07-22 | Payer: Self-pay | Source: Ambulatory Visit | Admitting: Primary Care

## 2013-07-27 ENCOUNTER — Ambulatory Visit
Admit: 2013-07-27 | Discharge: 2013-07-27 | Disposition: A | Discharge status: Home / Self Care | Payer: Self-pay | Source: Ambulatory Visit | Attending: Primary Care | Admitting: Primary Care

## 2013-07-27 LAB — PAIN CLINIC PROFILE
Amphetamine,UR: NEGATIVE
Benzodiazepinen,UR: NEGATIVE
Cocaine/Metab,UR: NEGATIVE
Opiates,UR: NEGATIVE
Oxycodone/Oxymorphone,UR: POSITIVE
THC Metabolite,UR: NEGATIVE

## 2013-07-30 LAB — CONFIRM OPIATES: Confirm Opiates: POSITIVE

## 2013-08-13 ENCOUNTER — Ambulatory Visit
Admit: 2013-08-13 | Discharge: 2013-08-13 | Disposition: A | Discharge status: Home / Self Care | Payer: Self-pay | Source: Ambulatory Visit | Attending: Primary Care | Admitting: Primary Care

## 2013-08-17 ENCOUNTER — Ambulatory Visit: Payer: Self-pay | Admitting: Orthopedic Surgery

## 2013-08-17 ENCOUNTER — Encounter: Payer: Self-pay | Admitting: Orthopedic Surgery

## 2013-08-17 VITALS — BP 132/80 | Ht 71.0 in | Wt 216.0 lb

## 2013-08-17 DIAGNOSIS — S82899A Other fracture of unspecified lower leg, initial encounter for closed fracture: Secondary | ICD-10-CM

## 2013-08-17 NOTE — Progress Notes (Signed)
CC: Patient presents for follow up of his right ankle ORIF on 02/24/13    HPI: Barry [name] 52 y.o. male  is here for follow up of his right ankle ORIF.  He has been WB as tolerated, still having some swelling, and requests more narcotic pain medication today.  He denies fevers, chills or other symptoms.      Physical Exam:  Constitutional-Healthy, well appearing, NAD.  Blood pressure 132/80, height 1.803 m (5\' 11" ), weight 97.977 kg (216 lb).  Musculoskeletal-  Focused exam of his right ankle shows some mild swelling, intact and benign appearing incisions, he has intact motor strength with ankle dorsiflexion and plantarflexion, sensation is intact over his dorsal and plantar foot, his foot is warm and well perfused.      Imaging:  I personally reviewed the images.  xrays of the right ankle show intact hardware and healing of the fracture    Assessment: 52 yo male s/p right ankle ORIF, now 6 months out.  We discussed today that we are not going to provide him with any more narcotics.  He should continue with self directed PT.    Plan: Follow up prn    Gabrielle Dare, MD  08/17/2013  8:42 AM      I saw and evaluated the patient. I agree with the resident's/fellow's findings and plan of care as documented above.    Serita Grammes, MD

## 2013-08-27 ENCOUNTER — Ambulatory Visit
Admit: 2013-08-27 | Discharge: 2013-08-27 | Disposition: A | Discharge status: Home / Self Care | Payer: Self-pay | Source: Ambulatory Visit | Attending: Primary Care | Admitting: Primary Care

## 2013-08-28 LAB — PAIN CLINIC PROFILE
Amphetamine,UR: NEGATIVE
Benzodiazepinen,UR: NEGATIVE
Cocaine/Metab,UR: NEGATIVE
Opiates,UR: NEGATIVE
Oxycodone/Oxymorphone,UR: NEGATIVE
THC Metabolite,UR: NEGATIVE

## 2013-08-30 ENCOUNTER — Ambulatory Visit: Payer: Self-pay | Admitting: Neurosurgery

## 2013-08-30 ENCOUNTER — Encounter: Payer: Self-pay | Admitting: Neurosurgery

## 2013-08-30 ENCOUNTER — Telehealth: Payer: Self-pay | Admitting: Pain Medicine

## 2013-08-30 VITALS — BP 168/88 | HR 82 | Resp 22 | Ht 71.0 in | Wt 227.0 lb

## 2013-08-30 DIAGNOSIS — M48062 Spinal stenosis, lumbar region with neurogenic claudication: Secondary | ICD-10-CM
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
## 2013-08-30 NOTE — Telephone Encounter (Signed)
Patient has been scheduled

## 2013-08-30 NOTE — Telephone Encounter (Signed)
Schedule with Keenan Trefry or Dr. Villarreal

## 2013-08-31 ENCOUNTER — Encounter: Payer: Self-pay | Admitting: Gastroenterology

## 2013-08-31 ENCOUNTER — Ambulatory Visit: Payer: Self-pay | Admitting: Pain Medicine

## 2013-08-31 ENCOUNTER — Encounter: Payer: Self-pay | Admitting: Pain Medicine

## 2013-08-31 VITALS — BP 157/95 | HR 82 | Resp 16 | Ht 71.0 in | Wt 220.0 lb

## 2013-08-31 DIAGNOSIS — M7918 Myalgia, other site: Secondary | ICD-10-CM

## 2013-08-31 DIAGNOSIS — M542 Cervicalgia: Secondary | ICD-10-CM

## 2013-08-31 DIAGNOSIS — S82899A Other fracture of unspecified lower leg, initial encounter for closed fracture: Secondary | ICD-10-CM

## 2013-08-31 DIAGNOSIS — M79604 Pain in right leg: Secondary | ICD-10-CM

## 2013-08-31 DIAGNOSIS — Z79899 Other long term (current) drug therapy: Secondary | ICD-10-CM

## 2013-08-31 DIAGNOSIS — M545 Low back pain, unspecified: Secondary | ICD-10-CM

## 2013-08-31 LAB — POCT URINE DRUG SCREEN 5 PANEL
COC (300ng/mL): NEGATIVE
Exp date: 122015
Kit Lot: 1406031
MTD (300ng/mL): NEGATIVE
OPI (300ng/mL): NEGATIVE
OXY (100ng/mL): NEGATIVE
THC (50ng/mL): NEGATIVE

## 2013-08-31 MED ORDER — DICLOFENAC SODIUM 1 % EX GEL *I*
CUTANEOUS | Status: DC
Start: 2013-08-31 — End: 2014-05-10

## 2013-08-31 MED ORDER — BACLOFEN 10 MG PO TABS *I*
10.0000 mg | ORAL_TABLET | Freq: Three times a day (TID) | ORAL | Status: DC
Start: 2013-08-31 — End: 2014-10-20

## 2013-08-31 NOTE — Patient Instructions (Signed)
1. Baclofen for neuropathic pain and or muscle spasm.  Start with 10 mg twice a day.  After 2-3 days increase to 10 mg three times a day.  Some may require 20 mg three times a day.  The most common side effects are dizziness and weakness.    Never stop this medication abruptly as you may become severely ill. It must be weaned off slowly.    2. We recommend starting diclofenac (Cataflam, Voltaren) apply 4 times a day. A prescription was provided.  Indications, titration and SE were reviewed. We reviewed GI risks; signs and symptoms of GI bleed to include black tarry stools, melanin stools, and gastric upset.  We reviewed while taking this medication drink 6-8 glasses of water a day. Take this medication with food.    3. Physical Therapy Program; 12 visits are requested for strengthening, stretching, muscular and joint protection education.   The goal is to rehabilitate for increased function, evaluation for and treatment for work return.  The visits may be 1-3 times a week for 1-3 months depending on the physical therapist evaluation and patient progress.       There is a possibility that the patient may require more visits in the future based on progress. If unable to tolerate the land therapy convert to aqua therapy at the discretion of the physical therapist.  We discussed the need to maintain exercises at home while participating in formal PT and to continue exercise at home indefinitely to maintain results.  There will be some expected pain and to work through. If there is a question regarding the possibility of re injury or worsen injury the patient or physical therapist should contact us.    4. Diagnosis- 729.1(Myalgia and myositis, unspecified)    Theracane is recommended to self treat myofacial pain and trigger points in conjunction and additive to other prescribed therapies to reduce pain. Theracane instruction sheet was provided.
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
## 2013-09-02 DIAGNOSIS — M545 Low back pain, unspecified: Secondary | ICD-10-CM | POA: Insufficient documentation

## 2013-09-08 ENCOUNTER — Telehealth: Payer: Self-pay | Admitting: Pain Medicine

## 2013-09-08 MED ORDER — DICLOFENAC SODIUM 50 MG PO TBEC *I*
50.0000 mg | DELAYED_RELEASE_TABLET | Freq: Two times a day (BID) | ORAL | Status: DC
Start: 2013-09-08 — End: 2014-10-20

## 2013-09-09 ENCOUNTER — Encounter: Payer: Self-pay | Admitting: Gastroenterology

## 2013-09-14 ENCOUNTER — Telehealth: Payer: Self-pay | Admitting: Orthopedic Surgery

## 2013-09-14 ENCOUNTER — Other Ambulatory Visit: Payer: Self-pay | Admitting: Surgery

## 2013-09-14 NOTE — Telephone Encounter (Signed)
PT requesting updated script (back dated to 9/8).

## 2013-10-05 ENCOUNTER — Ambulatory Visit
Admit: 2013-10-05 | Discharge: 2013-10-05 | Disposition: A | Discharge status: Home / Self Care | Payer: Self-pay | Source: Ambulatory Visit | Attending: Primary Care | Admitting: Primary Care

## 2013-10-05 LAB — PAIN CLINIC PROFILE
Amphetamine,UR: NEGATIVE
Benzodiazepinen,UR: NEGATIVE
Cocaine/Metab,UR: NEGATIVE
Opiates,UR: NEGATIVE
Oxycodone/Oxymorphone,UR: NEGATIVE
THC Metabolite,UR: NEGATIVE

## 2013-10-27 ENCOUNTER — Encounter: Payer: Self-pay | Admitting: Gastroenterology

## 2013-10-27 NOTE — Progress Notes (Signed)
Left message to call office to schedule screening colonoscopy.

## 2014-04-07 ENCOUNTER — Telehealth: Payer: Self-pay | Admitting: Pain Medicine

## 2014-04-07 NOTE — Telephone Encounter (Signed)
Spoke with [employer].  SHe will fax over PT notes.

## 2014-04-07 NOTE — Telephone Encounter (Signed)
Patient is requesting a follow up appointment.  Last seen Sept 2014.  Patient instructions don't mention follow up.  OK to schedule?

## 2014-04-07 NOTE — Telephone Encounter (Signed)
I can see her if she completed PT.  I need the notes from PT

## 2014-04-08 ENCOUNTER — Encounter: Payer: Self-pay | Admitting: Gastroenterology

## 2014-04-12 ENCOUNTER — Ambulatory Visit: Payer: Self-pay | Admitting: Pain Medicine

## 2014-04-12 NOTE — Progress Notes (Signed)
No show

## 2014-05-09 ENCOUNTER — Encounter: Payer: Self-pay | Admitting: Internal Medicine

## 2014-05-09 ENCOUNTER — Emergency Department: Admit: 2014-05-09 | Disposition: A | Discharge status: Home / Self Care | Payer: Self-pay | Source: Ambulatory Visit

## 2014-05-09 MED ORDER — KETOROLAC TROMETHAMINE 30 MG/ML IJ SOLN *I*
30.0000 mg | Freq: Once | INTRAMUSCULAR | Status: AC
Start: 2014-05-10 — End: 2014-05-10
  Administered 2014-05-10: 30 mg via INTRAMUSCULAR
  Filled 2014-05-09: qty 1

## 2014-05-09 MED ORDER — DIAZEPAM 5 MG PO TABS *I*
5.0000 mg | ORAL_TABLET | Freq: Once | ORAL | Status: AC
Start: 2014-05-10 — End: 2014-05-10
  Administered 2014-05-10: 5 mg via ORAL
  Filled 2014-05-09: qty 1

## 2014-05-09 NOTE — ED Notes (Signed)
Pt has chronic back and neck pain and today reports "pressure" in his lower back with tingling to his legs
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
## 2014-05-09 NOTE — ED Notes (Addendum)
Patient called me to room and c/o no TV and "Waiting for hours" for something for pain. Shouting. Smells of ETOH strongly.

## 2014-05-09 NOTE — ED Notes (Signed)
Pt ambulatory to the restroom no assistance

## 2014-05-10 MED ORDER — HYDROMORPHONE HCL PF 1 MG/ML IJ SOLN *WRAPPED*
1.0000 mg | Freq: Once | INTRAMUSCULAR | Status: AC
Start: 2014-05-10 — End: 2014-05-10

## 2014-05-10 MED ORDER — HYDROMORPHONE HCL PF 1 MG/ML IJ SOLN *WRAPPED*
INTRAMUSCULAR | Status: AC
Start: 2014-05-10 — End: 2014-05-10
  Administered 2014-05-10: 1 mg via INTRAMUSCULAR
  Filled 2014-05-10: qty 1

## 2014-05-10 MED ORDER — DICLOFENAC SODIUM 1 % EX GEL *I*
CUTANEOUS | Status: DC
Start: 2014-05-10 — End: 2014-05-10

## 2014-05-10 MED ORDER — DICLOFENAC SODIUM 1 % EX GEL *I*
CUTANEOUS | Status: DC
Start: 2014-05-10 — End: 2014-10-20

## 2014-05-10 NOTE — Discharge Instructions (Signed)
You had a complete physical exam today  You need to follow up with neuro surgery; please call first thing in the morning for an appointment to be seen    Please continue to take your daily medications and the Voltaren gel as needed four times daily.     Return to ED if you develop any incontinence of urine/stool, worsening symptoms or pain.

## 2014-05-11 ENCOUNTER — Telehealth: Payer: Self-pay | Admitting: Pain Medicine

## 2014-05-11 NOTE — Telephone Encounter (Signed)
I need to review pT notes.  Did we get them.  I do not see under media but may not be seeing

## 2014-05-11 NOTE — Telephone Encounter (Signed)
Was no show for his last appointment.  Called and wants to schedule.  OK?

## 2014-05-17 ENCOUNTER — Ambulatory Visit: Payer: Self-pay | Admitting: Pain Medicine

## 2014-05-17 NOTE — Progress Notes (Signed)
No show

## 2014-06-30 ENCOUNTER — Emergency Department (HOSPITAL_COMMUNITY)
Admission: EM | Admit: 2014-06-30 | Discharge: 2014-06-30 | Disposition: A | Discharge status: Home / Self Care | Payer: Medicare (Managed Care) | Attending: Emergency Medicine | Admitting: Emergency Medicine

## 2014-06-30 ENCOUNTER — Encounter (HOSPITAL_COMMUNITY): Payer: Self-pay | Admitting: Emergency Medicine

## 2014-06-30 DIAGNOSIS — Z79899 Other long term (current) drug therapy: Secondary | ICD-10-CM | POA: Insufficient documentation

## 2014-06-30 DIAGNOSIS — G44219 Episodic tension-type headache, not intractable: Secondary | ICD-10-CM | POA: Diagnosis not present

## 2014-06-30 DIAGNOSIS — F329 Major depressive disorder, single episode, unspecified: Secondary | ICD-10-CM

## 2014-06-30 DIAGNOSIS — F32A Depression, unspecified: Secondary | ICD-10-CM

## 2014-06-30 DIAGNOSIS — Z720 Tobacco use: Secondary | ICD-10-CM

## 2014-06-30 DIAGNOSIS — I10 Essential (primary) hypertension: Secondary | ICD-10-CM

## 2014-06-30 DIAGNOSIS — E119 Type 2 diabetes mellitus without complications: Secondary | ICD-10-CM

## 2014-06-30 DIAGNOSIS — R51 Headache: Secondary | ICD-10-CM | POA: Diagnosis present

## 2014-06-30 MED ORDER — KETOROLAC TROMETHAMINE 30 MG/ML IJ SOLN
30.0000 mg | Freq: Once | INTRAMUSCULAR | Status: AC
  Administered 2014-06-30: 30 mg via INTRAVENOUS
  Filled 2014-06-30: qty 1

## 2014-06-30 MED ORDER — OXYCODONE-ACETAMINOPHEN 5-325 MG PO TABS: 1.0000 | ORAL_TABLET | ORAL | Status: AC | PRN

## 2014-06-30 MED ORDER — PROCHLORPERAZINE EDISYLATE 5 MG/ML IJ SOLN
10.0000 mg | Freq: Once | INTRAMUSCULAR | Status: AC
  Administered 2014-06-30: 10 mg via INTRAVENOUS
  Filled 2014-06-30: qty 2

## 2014-06-30 MED ORDER — DIPHENHYDRAMINE HCL 50 MG/ML IJ SOLN
25.0000 mg | Freq: Once | INTRAMUSCULAR | Status: AC
  Administered 2014-06-30: 25 mg via INTRAVENOUS
  Filled 2014-06-30: qty 1

## 2014-06-30 NOTE — ED Provider Notes (Signed)
CSN: [id number]     Arrival date & time 06/30/14  1132 History   First MD Initiated Contact with Patient 06/30/14 1139     Chief Complaint  Patient presents with  . Headache     (Consider location/radiation/quality/duration/timing/severity/associated sxs/prior Treatment) HPI Comments: Patient is here with multiple complaints. Chiefly he is here with a complaint of headache which has been ongoing for 3 weeks. It is left occipital, dull, aching, worsening, unrelieved with one dose of Tylenol 2 weeks ago. He has associated photophobia and phonophobia denies nausea, vomiting, or focal neurologic complaints. Denies photophobia, phonophobia, UL throbbing, N/V, visual changes, stiff neck, neck pain, rash, or "thunderclap" onset.  Patient states that he has been traveling for the past month he is from [place]. He has been out of all of his medications. He complains of bilateral ankle pain from a fall and this pain is chronic. He is managed by pain management in [place]. Patient states he is unable to obtain his medications here in [place].  Patient is a 53 y.o. male presenting with headaches. The history is provided by the patient. No language interpreter was used.  Headache Pain location:  Occipital Quality:  Dull Radiates to:  Does not radiate Severity currently:  4/10 Severity at highest:  5/10 Duration:  2 weeks Timing:  Constant Progression:  Worsening (worsening over the last few days) Chronicity:  Recurrent Similar to prior headaches: yes   Context: bright light and loud noise   Relieved by:  Nothing Ineffective treatments:  Acetaminophen Associated symptoms: photophobia   Associated symptoms: no abdominal pain, no back pain, no blurred vision, no congestion, no cough, no diarrhea, no dizziness, no drainage, no ear pain, no pain, no facial pain, no fatigue, no fever, no focal weakness, no hearing loss, no loss of balance, no myalgias, no nausea, no  near-syncope, no neck pain, no neck stiffness, no numbness, no paresthesias, no seizures, no sinus pressure, no sore throat, no swollen glands, no syncope, no tingling, no URI, no visual change, no vomiting and no weakness     History reviewed. No pertinent past medical history. Past Surgical History  Procedure Laterality Date  . Orthopedic surgery     History reviewed. No pertinent family history. History  Substance Use Topics  . Smoking status: Never Smoker   . Smokeless tobacco: Not on file  . Alcohol Use: No    Review of Systems  Constitutional: Negative for fever and fatigue.  HENT: Negative for congestion, ear pain, hearing loss, postnasal drip, sinus pressure and sore throat.   Eyes: Positive for photophobia. Negative for blurred vision and pain.  Respiratory: Negative for cough.   Cardiovascular: Negative for syncope and near-syncope.  Gastrointestinal: Negative for nausea, vomiting, abdominal pain and diarrhea.  Musculoskeletal: Negative for back pain, myalgias, neck pain and neck stiffness.  Skin: Negative for rash and wound.  Neurological: Positive for headaches. Negative for dizziness, focal weakness, seizures, numbness, paresthesias and loss of balance.  All other systems reviewed and are negative.     Allergies  Review of patient's allergies indicates no known allergies.  Home Medications   Prior to Admission medications   Medication Sig Start Date End Date Taking? Authorizing Provider  omeprazole (PRILOSEC) 20 MG capsule Take 20 mg by mouth daily.  03/23/14  Yes Historical Provider, MD   BP 140/104  Pulse 82  Temp(Src) 98.3 F (36.8 C) (Oral)  Resp 18  Ht 5\' 11"  (1.803 m)  Wt 216 lb (  97.977 kg)  BMI 30.14 kg/m2  SpO2 98% Physical Exam  Nursing note and vitals reviewed. Constitutional: He appears well-developed and well-nourished. No distress.  HENT:  Head: Normocephalic and atraumatic.  Eyes: Conjunctivae are normal. No scleral icterus.  Neck:  Normal range of motion. Neck supple.  Cardiovascular: Normal rate, regular rhythm and normal heart sounds.   Pulmonary/Chest: Effort normal and breath sounds normal. No respiratory distress.  Abdominal: Soft. There is no tenderness.  Musculoskeletal: He exhibits no edema.  Well healing surgical scar of the right ankle, no swelling, limited range of motion distal pulse intact  Neurological: He is alert.  Speech is clear and goal oriented, follows commands Major Cranial nerves without deficit, no facial droop Normal strength in upper and lower extremities bilaterally including dorsiflexion and plantar flexion, strong and equal grip strength Sensation normal to light and sharp touch Moves extremities without ataxia, coordination intact Normal finger to nose and rapid alternating movements Neg romberg, no pronator drift Antalgic gait Normal heel-shin and balance   Skin: Skin is warm and dry. He is not diaphoretic.  Psychiatric: His behavior is normal.    ED Course  Procedures (including critical care time) Labs Review Labs Reviewed - No data to display  Imaging Review No results found.   EKG Interpretation None      MDM   Final diagnoses:  Episodic tension-type headache, not intractable    1:09 PM BP 100/56  Pulse 73  Temp(Src) 98.3 F (36.8 C) (Oral)  Resp 13  Ht 5\' 11"  (1.803 m)  Wt 216 lb (97.977 kg)  BMI 30.14 kg/m2  SpO2 96%  I have spoken with the patient[place] pain manger in [place]. He has only been once about 1 year ago and has not followed up. He is not under contract wit the clinic. Pt HA treated and improved while in ED.  Presentation is like pts typical HA and non concerning for [facility], ICH, Meningitis, or temporal arteritis. Pt is afebrile with no focal neuro deficits, nuchal rigidity, or change in vision.  Will D/c with a small amount of pain medication. Pt verbalizes understanding and is agreeable with plan to dc.     Arthor CaptainAbigail Salimata Christenson, PA-C 07/01/14  1106

## 2014-06-30 NOTE — ED Notes (Signed)
C/o headache to left posterior headache since July 3rd  Denies recent injury also c/o pain to right ankle had plate/screws placed in march 2015 and pain tyo left lower ribs has been coughing a lot per pt

## 2014-06-30 NOTE — Discharge Instructions (Signed)
You are having a headache. No specific cause was found today for your headache. It may have been a migraine or other cause of headache. Stress, anxiety, fatigue, and depression are common triggers for headaches. Your headache today does not appear to be life-threatening or require hospitalization, but often the exact cause of headaches is not determined in the emergency department. Therefore, follow-up with your doctor is very important to find out what may have caused your headache, and whether or not you need any further diagnostic testing or treatment. Sometimes headaches can appear benign (not harmful), but then more serious symptoms can develop which should prompt an immediate re-evaluation by your doctor or the emergency department. SEEK MEDICAL ATTENTION IF: You develop possible problems with medications prescribed.  The medications don't resolve your headache, if it recurs , or if you have multiple episodes of vomiting or can't take fluids. You have a change from the usual headache. RETURN IMMEDIATELY IF you develop a sudden, severe headache or confusion, become poorly responsive or faint, develop a fever above 100.40F or problem breathing, have a change in speech, vision, swallowing, or understanding, or develop new weakness, numbness, tingling, incoordination, or have a seizure.  Tension Headache A tension headache is a feeling of pain, pressure, or aching often felt over the front and sides of the head. The pain can be dull or can feel tight (constricting). It is the most common type of headache. Tension headaches are not normally associated with nausea or vomiting and do not get worse with physical activity. Tension headaches can last 30 minutes to several days.  CAUSES  The exact cause is not known, but it may be caused by chemicals and hormones in the brain that lead to pain. Tension headaches often begin after stress, anxiety, or depression. Other triggers may include:  Alcohol.  Caffeine  (too much or withdrawal).  Respiratory infections (colds, flu, sinus infections).  Dental problems or teeth clenching.  Fatigue.  Holding your head and neck in one position too long while using a computer. SYMPTOMS   Pressure around the head.   Dull, aching head pain.   Pain felt over the front and sides of the head.   Tenderness in the muscles of the head, neck, and shoulders. DIAGNOSIS  A tension headache is often diagnosed based on:   Symptoms.   Physical examination.   A CT scan or MRI of your head. These tests may be ordered if symptoms are severe or unusual. TREATMENT  Medicines may be given to help relieve symptoms.  HOME CARE INSTRUCTIONS   Only take over-the-counter or prescription medicines for pain or discomfort as directed by your caregiver.   Lie down in a dark, quiet room when you have a headache.   Keep a journal to find out what may be triggering your headaches. For example, write down:  What you eat and drink.  How much sleep you get.  Any change to your diet or medicines.  Try massage or other relaxation techniques.   Ice packs or heat applied to the head and neck can be used. Use these 3 to 4 times per day for 15 to 20 minutes each time, or as needed.   Limit stress.   Sit up straight, and do not tense your muscles.   Quit smoking if you smoke.  Limit alcohol use.  Decrease the amount of caffeine you drink, or stop drinking caffeine.  Eat and exercise regularly.  Get 7 to 9 hours of sleep, or as  recommended by your caregiver. °· Avoid excessive use of pain medicine as recurrent headaches can occur.    °SEEK MEDICAL CARE IF:  °· You have problems with the medicines you were prescribed. °· Your medicines do not work. °· You have a change from the usual headache. °· You have nausea or vomiting. °SEEK IMMEDIATE MEDICAL CARE IF:  °· Your headache becomes severe. °· You have a fever. °· You have a stiff neck. °· You have loss of  vision. °· You have muscular weakness or loss of muscle control. °· You lose your balance or have trouble walking. °· You feel faint or pass out. °· You have severe symptoms that are different from your first symptoms. °MAKE SURE YOU:  °· Understand these instructions. °· Will watch your condition. °· Will get help right away if you are not doing well or get worse. °Document Released: 11/25/2005 Document Revised: 02/17/2012 Document Reviewed: 11/15/2011 °[employer]® Patient Information ©2015 [employer]. This information is not intended to replace advice given to you by your health care provider. Make sure you discuss any questions you have with your health care provider. ° °

## 2014-07-08 NOTE — ED Provider Notes (Signed)
Medical screening examination/treatment/procedure(s) were performed by non-physician practitioner and as supervising physician I was immediately available for consultation/collaboration.   EKG Interpretation None        Nishant Schrecengost, MD 07/08/14 0023 

## 2014-07-22 ENCOUNTER — Ambulatory Visit
Admit: 2014-07-22 | Discharge: 2014-07-22 | Disposition: A | Discharge status: Home / Self Care | Payer: Self-pay | Source: Ambulatory Visit | Attending: Primary Care | Admitting: Primary Care

## 2014-07-22 ENCOUNTER — Other Ambulatory Visit: Payer: Self-pay | Admitting: Internal Medicine

## 2014-07-22 DIAGNOSIS — S93409A Sprain of unspecified ligament of unspecified ankle, initial encounter: Secondary | ICD-10-CM

## 2014-07-25 ENCOUNTER — Ambulatory Visit
Admit: 2014-07-25 | Discharge: 2014-07-25 | Disposition: A | Discharge status: Home / Self Care | Payer: Self-pay | Source: Ambulatory Visit | Attending: Internal Medicine | Admitting: Internal Medicine

## 2014-07-25 LAB — LIPID PANEL
Chol/HDL Ratio: 5.6 — ABNORMAL HIGH (ref 0.0–5.0)
Cholesterol: 231 mg/dL — AB
HDL: 41 mg/dL
LDL Calculated: 155 mg/dL
Non HDL Cholesterol: 190 mg/dL
Triglycerides: 177 mg/dL — AB

## 2014-07-25 LAB — COMPREHENSIVE METABOLIC PANEL
ALT: 20 U/L (ref 0–50)
AST: 11 U/L (ref 0–50)
Albumin: 4.1 g/dL (ref 3.5–5.2)
Alk Phos: 82 U/L (ref 40–130)
Anion Gap: 13 (ref 7–16)
Bilirubin,Total: 0.2 mg/dL (ref 0.0–1.2)
CO2: 27 mmol/L (ref 20–28)
Calcium: 8.8 mg/dL (ref 8.6–10.2)
Chloride: 98 mmol/L (ref 96–108)
Creatinine: 1.09 mg/dL (ref 0.67–1.17)
GFR,Black: 89 *
GFR,Caucasian: 77 *
Globulin: 3.1 g/dL (ref 2.7–4.3)
Glucose: 193 mg/dL — ABNORMAL HIGH (ref 60–99)
Lab: 19 mg/dL (ref 6–20)
Potassium: 4.6 mmol/L (ref 3.3–5.1)
Sodium: 138 mmol/L (ref 133–145)
Total Protein: 7.2 g/dL (ref 6.3–7.7)

## 2014-07-26 LAB — HEMOGLOBIN A1C: Hemoglobin A1C: 8.2 % — ABNORMAL HIGH (ref 4.0–6.0)

## 2014-08-03 ENCOUNTER — Encounter: Payer: Self-pay | Admitting: Surgery

## 2014-08-03 ENCOUNTER — Ambulatory Visit: Payer: Self-pay | Admitting: Surgery

## 2014-08-03 VITALS — BP 145/85 | Ht 71.0 in | Wt 219.0 lb

## 2014-08-03 DIAGNOSIS — S82891S Other fracture of right lower leg, sequela: Secondary | ICD-10-CM

## 2014-08-03 NOTE — Progress Notes (Signed)
Patient presents for follow up of his right ankle ORIF on 02/24/13. He was last seen in our office 08/2013. He states he has had ankle pain since the injury.  He initially work with physical therapy but was not making any progress so it was discontinued.  He recently saw his PCP 2 weeks ago for the same issue.  X-rays were taken.  He was referred to the pain clinic and physical therapy.  He takes baclofen and tramadol for chronic back pain.  He is not using any assistive devices for ambulation. He denies any recent fevers or chills.  He denies numbness or tingling in the right lower extremity.  No other concerns or complaints today.    Physical Exam:  Constitutional-Healthy, well appearing, NAD. He ambulates with a steady brisk gait.  Musculoskeletal- Focused exam of his right ankle shows no swelling, healed incisions, no erythema.  He is nontender to palpation over his hardware.  He localizes the pain diffusely to the entire ankle joint. I cannot reproduce this pain. Ankle dorsiflexion 15, plantar flexion 40 the. he has intact motor strength with ankle dorsiflexion and plantarflexion, sensation is intact over his dorsal and plantar foot, his foot is warm and well perfused.     Imaging:  I personally reviewed the images. xrays of the right ankle show intact hardware and healed fractures.    Assessment: 53 yo male s/p right ankle ORIF, now approximately 18 months out. He is going to follow through with the pain management referral from his PCP as well as a physical therapy referral from his PCP. He should continue with self directed PT. He agrees with this plan.    Plan: Follow up prn    This document was dictated with Dragon voice recognition software. Please excuse any errors as the document was typeread to the best of my ability.

## 2014-08-18 ENCOUNTER — Ambulatory Visit: Payer: Self-pay | Admitting: Internal Medicine

## 2014-10-20 ENCOUNTER — Ambulatory Visit: Payer: Self-pay | Admitting: Student in an Organized Health Care Education/Training Program

## 2014-10-20 VITALS — BP 144/84 | HR 72 | Ht 69.0 in | Wt 209.0 lb

## 2014-10-20 DIAGNOSIS — G8929 Other chronic pain: Secondary | ICD-10-CM

## 2014-10-20 DIAGNOSIS — M25571 Pain in right ankle and joints of right foot: Secondary | ICD-10-CM

## 2014-10-20 DIAGNOSIS — E1165 Type 2 diabetes mellitus with hyperglycemia: Secondary | ICD-10-CM

## 2014-10-20 DIAGNOSIS — S82891S Other fracture of right lower leg, sequela: Secondary | ICD-10-CM

## 2014-10-20 DIAGNOSIS — K0889 Other specified disorders of teeth and supporting structures: Secondary | ICD-10-CM

## 2014-10-20 MED ORDER — ACETAMINOPHEN 500 MG PO TABS *I*
1000.0000 mg | ORAL_TABLET | Freq: Three times a day (TID) | ORAL | Status: AC
Start: 2014-10-20 — End: ?

## 2014-10-20 MED ORDER — METFORMIN HCL 500 MG PO TABS *I*
500.0000 mg | ORAL_TABLET | Freq: Two times a day (BID) | ORAL | Status: AC
Start: 2014-10-20 — End: 2015-04-18

## 2014-10-20 MED ORDER — MELOXICAM 7.5 MG PO TABS *I*
15.0000 mg | ORAL_TABLET | Freq: Every day | ORAL | Status: DC
Start: 2014-10-20 — End: 2015-06-20

## 2014-10-20 MED ORDER — LIDOCAINE 5 % EX OINT *I*
TOPICAL_OINTMENT | Freq: Three times a day (TID) | CUTANEOUS | Status: AC | PRN
Start: 2014-10-20 — End: ?

## 2014-10-20 MED ORDER — FAMOTIDINE 20 MG PO TABS *I*
20.0000 mg | ORAL_TABLET | Freq: Two times a day (BID) | ORAL | Status: AC
Start: 2014-10-20 — End: ?

## 2014-10-20 NOTE — Progress Notes (Signed)
[facility] Academic Practice Note      Chief Complaint: Barry [name] is a 53 y.o. male who presents to establish care    HPI:  History of open reduction and internal fixation of right trimalleolar ankle fracture 02/24/13 when patient slipped on black ice and broke his ankle in multiple locations requiring several screws and a plate to stabilize. Followed by Dr. Sallee LangeSoles with Ortho. Has tried many different pain regimens including percocets, tylenol, high dose ibuprofen both 600 and 800mg , diclofenac pills and gel, meloxicam. Currently taking 600mg  ibuprofen TID to QID with some relief, although ankle pain is exacerbated by weight bearing activities. Patient has also finished physical therapy and reports they said they could not help him any more.    Patient also complains of some low back and neck pain consistent with known mild degenerative disease.     Patient reports he has previously been on metoprolol out of concern for prior cardiac arrythmia and his blood pressure. He no longer has metoprolol. On review, patient's most recent EKG was sinus rhythm. No prior cardiac echo. Not followed by cardiology at this time.      Patient's allergies, past medical, surgical, social and family histories were reviewed, updated.  Previsit worksheet reviewed: Yes    Medications:   Current Outpatient Prescriptions   Medication Sig    cyclobenzaprine (FLEXERIL) 10 MG tablet Take 10 mg by mouth 3 times daily as needed for Muscle spasms    meloxicam (MOBIC) 7.5 MG tablet Take 2 tablets (15 mg total) by mouth daily    acetaminophen (TYLENOL) 500 mg tablet Take 2 tablets (1,000 mg total) by mouth 3 times daily    metFORMIN (GLUCOPHAGE) 500 MG tablet Take 1 tablet (500 mg total) by mouth 2 times daily (with meals)    famotidine (PEPCID) 20 MG tablet Take 1 tablet (20 mg total) by mouth 2 times daily    lidocaine (XYLOCAINE) 5 % ointment Apply topically 3 times daily as needed for Pain    metoprolol (LOPRESSOR) 50 MG tablet Take 50 mg by  mouth 2 times daily.     No current facility-administered medications for this visit.        Review of Systems - negative except for noted above in HPI.    Exam:   Filed Vitals:    10/20/14 1552   BP: 144/84   Pulse: 72   Height: 1.753 m (5\' 9" )   Weight: 94.802 kg (209 lb)       General: NAD, pleasant African-American male appears stated age  HEENT: No oropharyngeal lesions, EOMI, PERRL  Cardiac: RRR, no m/r/g  Resp: CTAB, no w/r/r  Abd: Soft, NT/ND, +BSx4  Ext: Full strength throughout upper and lower extremity  Neuro: CNII-XII grossly intact  Skin: Healed surgical scar laterally across patient's R malleolus which appears C/D/I, no other focal skin lesions    Labs:  Reviewed and notable for A1c of 8.2%, Cholesterol of 231, Triglys of 177, Chol/HDL ratio of 5.6, Cr 1.09, K 4.6, Hepatic function labs were all WNL     ______________________________________________________________________  Barry [name] was seen today for the following diagnoses and associated orders for this visit:    Ankle fracture, right, sequela with Chronic pain of right ankle  Patient to stop taking Ibuprofen and other tylenol, will discard remaining pills at home and will fill the below scripts as prescribed:  - meloxicam (MOBIC) 7.5 MG tablet; Take 2 tablets (15 mg total) by mouth daily  - acetaminophen (TYLENOL) 500 mg  tablet; Take 2 tablets (1,000 mg total) by mouth 3 times daily  - lidocaine (XYLOCAINE) 5 % ointment; Apply topically 3 times daily as needed for Pain  - For GI prophylaxis while on meloxicam, patient will also be prescribed famotidine (PEPCID) 20 MG tablet; Take 1 tablet (20 mg total) by mouth 2 times daily    Type 2 diabetes mellitus with hyperglycemia  - metFORMIN (GLUCOPHAGE) 500 MG tablet; Take 1 tablet (500 mg total) by mouth 2 times daily (with meals)  - Basic metabolic panel; Future  - Hemoglobin A1c; Future  - Microalbumin, Urine, Random; Future  - Creatinine, urine; Future    Toothache  - AMB REFERRAL TO DENTISTRY    Patient  to follow up with me next week to address remainder of patient's chronic health history and risk factors.    Tacey HeapNicholas Indica Marcott, MD 10/20/2014 at 5:30 PM     [facility] Diabetes Plan    According to current ADA guidelines the patient A1C goal is: less than 7  The patient's last A1C was 07/25/2014: Hemoglobin A1C 8.2 %* (High; Ref range: 4.0 - 6.0 %), the patient is: above goal.  Diabetic foot exam was performed:  No  The plan to reach goal   1.  Reviewed pt's understanding of medications including barriers to adherence    2.  Recommended lifestyle modifications: weight reduction, exercise plan, diet improvements and medication compliance   3.  The patient understands their clinical goals and will undertake self-management recommendations including: weight reduction  exercise plan  diet improvements  glucose monitoring   4.  Medications Management: the following medication changes were made today: Placed patient on metformin 500mg  BID.   5.  Referral to Care Management:: No   6.  Patient Ed/Self Management tools provided: Yes

## 2014-10-20 NOTE — Patient Instructions (Addendum)
1. Take Tylenol 1,000mg  around the clock, three times a day  2. Take the Meloxicam 15mg  once a day, do not take any other anti-inflammatory drug like Ibuprofen or Naproxen  3. Use the Lidocaine jelly as needed, three times a day  4. Get blood work and urine studies done at one of the [facility]  5. Start taking both the Pepcid and Metformin as prescribed  6. [facility]: [phone]

## 2014-10-25 ENCOUNTER — Ambulatory Visit: Payer: Self-pay | Admitting: Student in an Organized Health Care Education/Training Program

## 2014-10-27 ENCOUNTER — Telehealth: Payer: Self-pay | Admitting: Internal Medicine

## 2014-10-27 NOTE — Telephone Encounter (Signed)
Patient no showed for an appt on 10/25/14.  He did state that he called to cancel because he did not have his lab work done but it was not cancelled in [employer].  He rescheduled with Dr Cherly Andersonaimee on 11/02/14

## 2014-11-02 ENCOUNTER — Telehealth: Payer: Self-pay | Admitting: Internal Medicine

## 2014-11-02 ENCOUNTER — Ambulatory Visit: Payer: Self-pay | Admitting: Cardiovascular Disease

## 2014-11-02 NOTE — Telephone Encounter (Signed)
Patient was called and told about his no show.  He forgot and hasnt had his urine done yet.  I also asked about his dental referral and gave him the number for [facility] here in our building.

## 2014-12-27 ENCOUNTER — Telehealth: Payer: Self-pay

## 2014-12-27 NOTE — Telephone Encounter (Signed)
He should contact his PCP or another pain provider.  He was a no show and did not follow through with PT.  Please give him a list of pain providers    Dr. Dan EuropeHossein Hadian, MD Pain Medicine   [address], [place], [place] [id number]     Dr. Kermit Baloavid S. Moorthi, MD Pain Medicine   [address], [place], [place] [id number]     Dr. Stann MainlandStuart Hanau, MD Pain Medicine   [address], [place], [place] [id number]     5 Dr. Alean RinneJennifer A. Gargano, MD Pain Medicine   [address], [place], [place]     Dr. Barnet GlasgowJoel L. Rudell CobbKent, MD Pain Medicine   [address], [place], [place]     Dr. Mammie RussianNita N. Noralee StainGrover, MD Pain Medicine   [address], [place], [place] [id number]     Dr. Florencia ReasonsAjai K. Catha GosselinNemani, MD Pain Medicine   [address], [place], [place]     Dr. Omelia BlackwaterNaseer A. Ashok Cordiaahir, MD Pain Medicine   [address], [place], [place] [id number]     Dr. Odetta Pinkoger R. Ng, MD Pain Medicine   [address], [place], [place] [id number]     Dr. Diona Fantihirag R. Allena KatzPatel, MD Pain Medicine   [address], [place], [place] [id number]     Dr. Hermenia Bersajeev Patel, MD Pain Medicine   [address], [place], [place] [id number]     Dr. Alesia BandaJohn Orsini, MD Pain Medicine   [address], [place], [place] [id number]     Precious Gildingajbala Thakur, MB Pain Medicine, Hospice Care and Palliative Medicine   [address], [place], [place] [id number]     Dr. Valentina Shaggyhristina M. Darin EngelsAbraham, MD Pain Medicine   [address], [place], [place] [id number]   [address], [place], [place] [id number]     Dr. Mariane MastersPaul Bigeleisen, MD Pain Medicine   [address], [place], [place] [id number]       Ina KickHemant Kalia, [place] BS Pain Medicine   [address], [place], [place] [id number] (1 more)   [address], [place], [place] [id number]   .   [place] Dr. Bradd CanaryMohammad R. Karim, MD Pain Medicine   [address], [place], [place] [id number]     Dr. Anastasia FiedlerAngela Mahajan, MD Pain Medicine   [address], [place], [place] [phone] (1 more)     Dr. Keane Policeangkhoa Phamdo, MD Pain Medicine   [address], [place], [place] [id number]   [facility]

## 2014-12-27 NOTE — Progress Notes (Signed)
Called patient back and let him know he can contact his PCP as well as a list of other Pain providers that were suggested for him to try. He stated he already has a list and will make a decision from this point as to what he will do.

## 2014-12-27 NOTE — Telephone Encounter (Signed)
Patient called asking if he could be seen by Dr. Volney AmericanVillarreal. He insisted on seeing him for his leg pain only. Is was last seen 08/31/13 as a NPV and has no showed to 2 appointments.

## 2015-01-20 IMAGING — CR DG ANKLE COMPLETE 3+V*R*
3 series · 3 of 3 positions shown · non-contrast
Comparison: None.

CLINICAL DATA: Ankle pain

RIGHT ANKLE - COMPLETE 3+ VIEW

[view not recorded (1 of 3)]
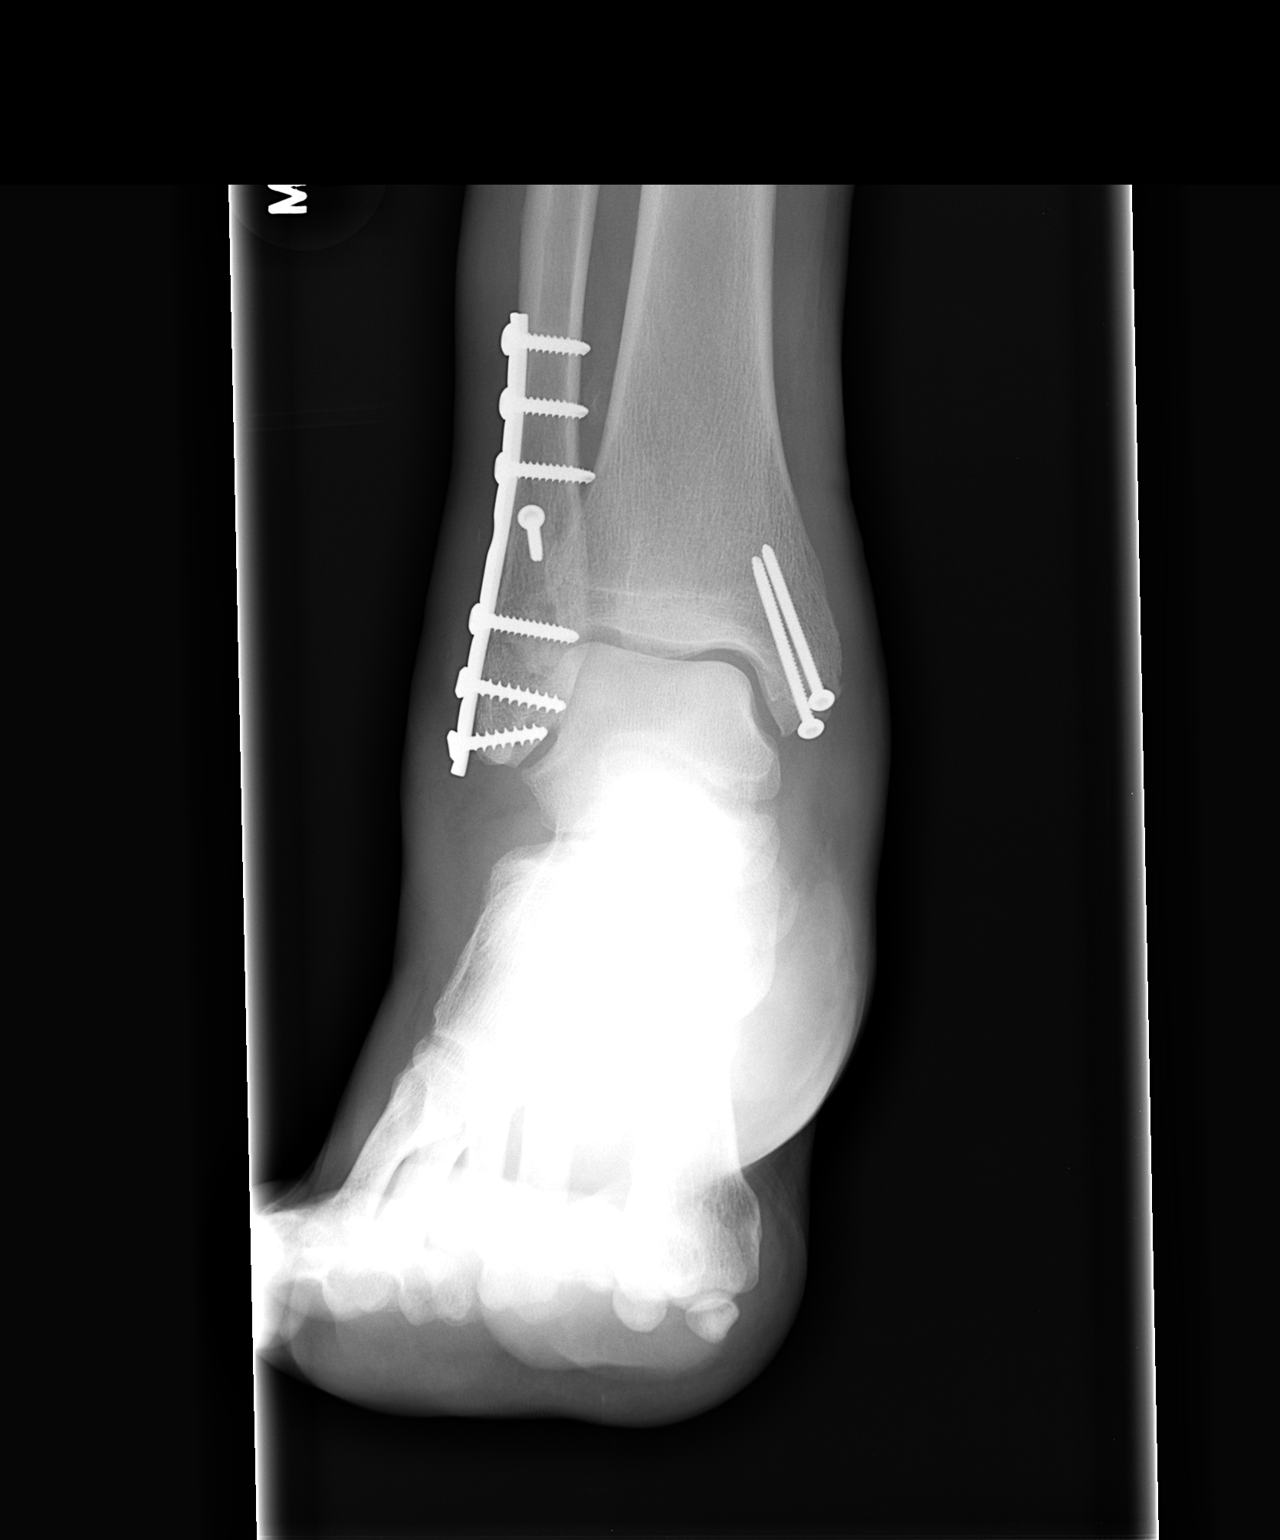

[view not recorded (2 of 3)]
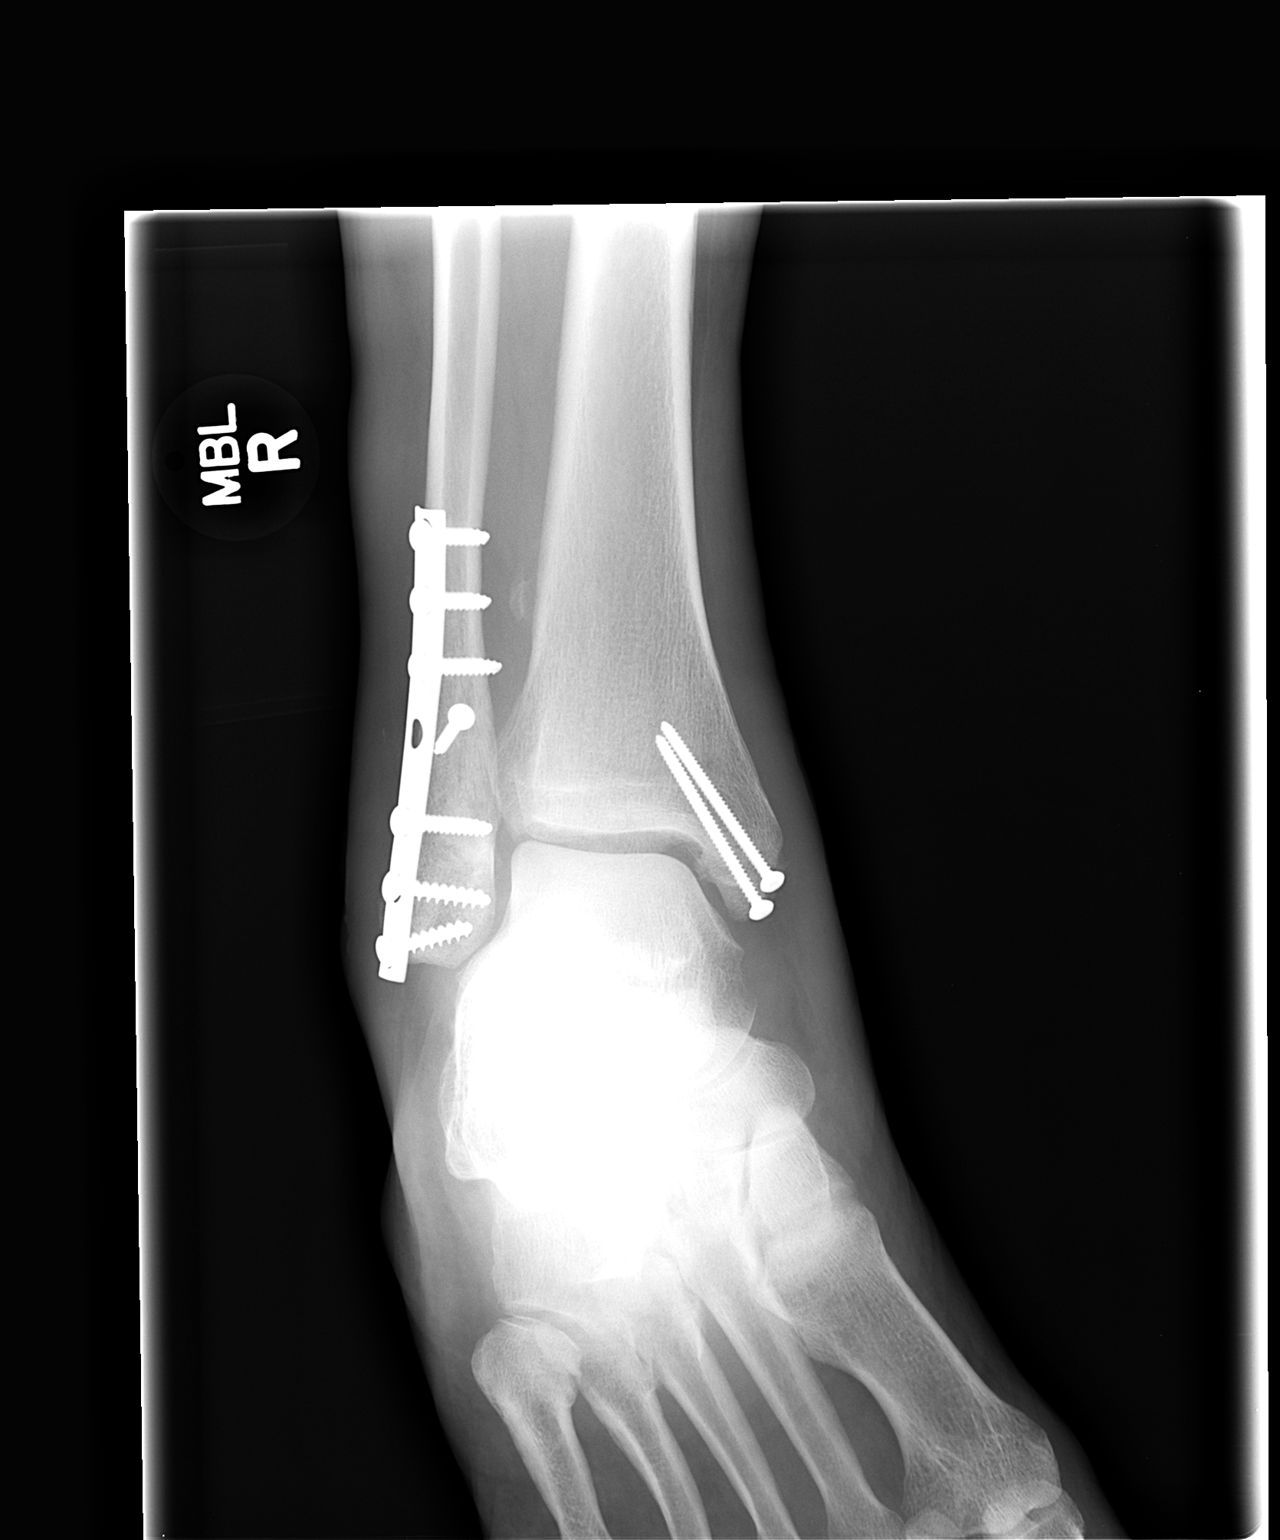

[view not recorded (3 of 3)]
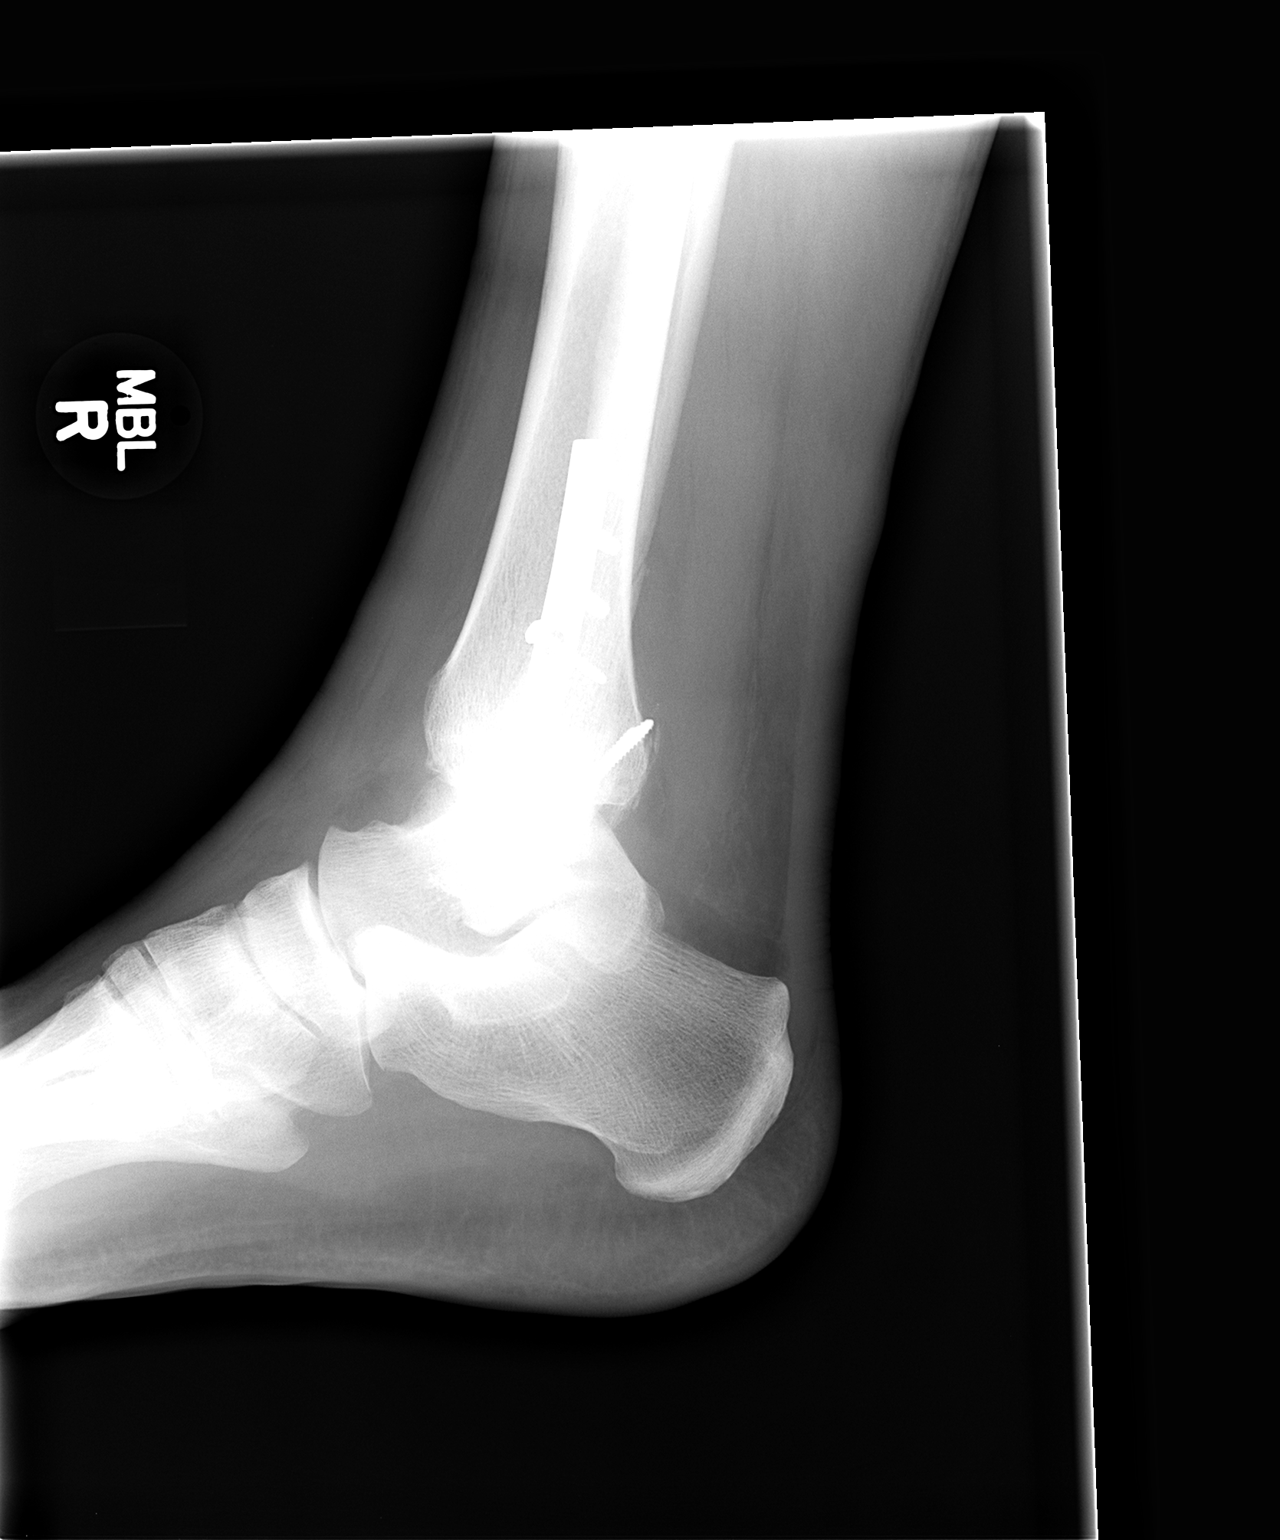

[3 of 3 positions shown; findings below may reference images not displayed]

FINDINGS: Three views of the right ankle submitted.  Metallic
fixation screws are noted in distal tibia medial malleolus.
Metallic plate and fixation screws are noted in the distal fibula.
There is anatomic alignment.  No acute fracture or subluxation.
IMPRESSION: No acute fracture or subluxation.  Postsurgical changes distal
tibia and fibula.

## 2015-06-19 ENCOUNTER — Telehealth: Payer: Self-pay | Admitting: Student in an Organized Health Care Education/Training Program

## 2015-06-19 NOTE — Telephone Encounter (Signed)
Received fax from pharmacy requesting to send script for  meloxicam  , 1  Every day , If appropriate.    We sent meloxicam 7.5mg  tab  #b 60 for 30 days, with sig of take 2 tabs PO daily. Please change and resend or advise.

## 2015-06-20 MED ORDER — MELOXICAM 15 MG PO TABS *I*
15.0000 mg | ORAL_TABLET | Freq: Every day | ORAL | 0 refills | Status: AC | PRN
Start: 2015-06-20 — End: 2015-07-20

## 2015-06-20 NOTE — Telephone Encounter (Signed)
I will send a refill for the medication for 1 month.  In the interim please have Mr. Barry [name] schedule follow up with Dr. Merryl Hackerschernia in August.

## 2015-06-21 NOTE — Telephone Encounter (Signed)
I called the patient and left a message to call and make an appt.

## 5111-08-10 DEATH — deceased
